# Patient Record
Sex: Female | Born: 1965 | Race: White | Hispanic: No | Marital: Single | Smoking: Former smoker
Health system: Southern US, Community
[De-identification: ages and names within clinical notes are randomized; demographics above are authoritative.]

## PROBLEM LIST (undated history)

## (undated) DIAGNOSIS — T7840XA Allergy, unspecified, initial encounter: Secondary | ICD-10-CM

## (undated) DIAGNOSIS — E7800 Pure hypercholesterolemia, unspecified: Secondary | ICD-10-CM

## (undated) HISTORY — DX: Allergy, unspecified, initial encounter: T78.40XA

## (undated) HISTORY — DX: Pure hypercholesterolemia, unspecified: E78.00

## (undated) HISTORY — PX: ABDOMINAL HYSTERECTOMY: SHX81

---

## 2002-10-20 HISTORY — PX: EYE SURGERY: SHX253

## 2008-08-20 HISTORY — PX: FOOT SURGERY: SHX648

## 2009-06-13 ENCOUNTER — Emergency Department: Payer: Self-pay | Admitting: Emergency Medicine

## 2010-05-08 ENCOUNTER — Ambulatory Visit: Payer: Self-pay | Admitting: Unknown Physician Specialty

## 2010-10-01 ENCOUNTER — Ambulatory Visit: Payer: Self-pay

## 2010-10-10 ENCOUNTER — Ambulatory Visit: Payer: Self-pay

## 2011-09-20 HISTORY — PX: HYSTERECTOMY ABDOMINAL WITH SALPINGECTOMY: SHX6725

## 2014-04-19 HISTORY — PX: BREAST BIOPSY: SHX20

## 2014-04-28 ENCOUNTER — Ambulatory Visit: Payer: Self-pay

## 2017-05-12 ENCOUNTER — Other Ambulatory Visit: Payer: Self-pay | Admitting: Family Medicine

## 2017-05-12 ENCOUNTER — Encounter: Payer: Self-pay | Admitting: Family Medicine

## 2017-05-12 ENCOUNTER — Encounter (INDEPENDENT_AMBULATORY_CARE_PROVIDER_SITE_OTHER): Payer: Self-pay

## 2017-05-12 ENCOUNTER — Ambulatory Visit (INDEPENDENT_AMBULATORY_CARE_PROVIDER_SITE_OTHER): Payer: Managed Care, Other (non HMO) | Admitting: Family Medicine

## 2017-05-12 VITALS — BP 106/70 | HR 71 | Temp 98.1°F | Ht 63.5 in | Wt 155.0 lb

## 2017-05-12 DIAGNOSIS — Z0000 Encounter for general adult medical examination without abnormal findings: Secondary | ICD-10-CM

## 2017-05-12 DIAGNOSIS — E7800 Pure hypercholesterolemia, unspecified: Secondary | ICD-10-CM

## 2017-05-12 DIAGNOSIS — Z131 Encounter for screening for diabetes mellitus: Secondary | ICD-10-CM

## 2017-05-12 DIAGNOSIS — Z13220 Encounter for screening for lipoid disorders: Secondary | ICD-10-CM | POA: Diagnosis not present

## 2017-05-12 DIAGNOSIS — Z1211 Encounter for screening for malignant neoplasm of colon: Secondary | ICD-10-CM

## 2017-05-12 DIAGNOSIS — Z23 Encounter for immunization: Secondary | ICD-10-CM | POA: Diagnosis not present

## 2017-05-12 DIAGNOSIS — E785 Hyperlipidemia, unspecified: Secondary | ICD-10-CM | POA: Insufficient documentation

## 2017-05-12 HISTORY — DX: Pure hypercholesterolemia, unspecified: E78.00

## 2017-05-12 LAB — LIPID PANEL
CHOLESTEROL: 221 mg/dL — AB (ref 0–200)
HDL: 57.7 mg/dL (ref >39.00)
LDL Cholesterol: 135 mg/dL — ABNORMAL HIGH (ref 0–99)
NonHDL: 163.53
TRIGLYCERIDES: 145 mg/dL (ref 0.0–149.0)
Total CHOL/HDL Ratio: 4
VLDL: 29 mg/dL (ref 0.0–40.0)

## 2017-05-12 LAB — BASIC METABOLIC PANEL
BUN: 16 mg/dL (ref 6–23)
CO2: 31 mEq/L (ref 19–32)
Calcium: 10.7 mg/dL — ABNORMAL HIGH (ref 8.4–10.5)
Chloride: 101 mEq/L (ref 96–112)
Creatinine, Ser: 0.83 mg/dL (ref 0.40–1.20)
GFR: 76.91 mL/min (ref >60.00)
Glucose, Bld: 92 mg/dL (ref 70–99)
POTASSIUM: 4 meq/L (ref 3.5–5.1)
Sodium: 140 mEq/L (ref 135–145)

## 2017-05-12 NOTE — Addendum Note (Signed)
Addended by: Pilar Grammes on: 05/12/2017 03:12 PM   Modules accepted: Orders

## 2017-05-12 NOTE — Patient Instructions (Addendum)
Tdap (tetanus and pertussis) today.  Labs today.  Return as needed or in 1 year for next physical.  Health Maintenance, Female Adopting a healthy lifestyle and getting preventive care can go a long way to promote health and wellness. Talk with your health care provider about what schedule of regular examinations is right for you. This is a good chance for you to check in with your provider about disease prevention and staying healthy. In between checkups, there are plenty of things you can do on your own. Experts have done a lot of research about which lifestyle changes and preventive measures are most likely to keep you healthy. Ask your health care provider for more information. Weight and diet Eat a healthy diet  Be sure to include plenty of vegetables, fruits, low-fat dairy products, and lean protein.  Do not eat a lot of foods high in solid fats, added sugars, or salt.  Get regular exercise. This is one of the most important things you can do for your health. ? Most adults should exercise for at least 150 minutes each week. The exercise should increase your heart rate and make you sweat (moderate-intensity exercise). ? Most adults should also do strengthening exercises at least twice a week. This is in addition to the moderate-intensity exercise.  Maintain a healthy weight  Body mass index (BMI) is a measurement that can be used to identify possible weight problems. It estimates body fat based on height and weight. Your health care provider can help determine your BMI and help you achieve or maintain a healthy weight.  For females 51 years of age and older: ? A BMI below 18.5 is considered underweight. ? A BMI of 18.5 to 24.9 is normal. ? A BMI of 25 to 29.9 is considered overweight. ? A BMI of 30 and above is considered obese.  Watch levels of cholesterol and blood lipids  You should start having your blood tested for lipids and cholesterol at 51 years of age, then have this test  every 5 years.  You may need to have your cholesterol levels checked more often if: ? Your lipid or cholesterol levels are high. ? You are older than 51 years of age. ? You are at high risk for heart disease.  Cancer screening Lung Cancer  Lung cancer screening is recommended for adults 51-20 years old who are at high risk for lung cancer because of a history of smoking.  A yearly low-dose CT scan of the lungs is recommended for people who: ? Currently smoke. ? Have quit within the past 15 years. ? Have at least a 30-pack-year history of smoking. A pack year is smoking an average of one pack of cigarettes a day for 1 year.  Yearly screening should continue until it has been 15 years since you quit.  Yearly screening should stop if you develop a health problem that would prevent you from having lung cancer treatment.  Breast Cancer  Practice breast self-awareness. This means understanding how your breasts normally appear and feel.  It also means doing regular breast self-exams. Let your health care provider know about any changes, no matter how small.  If you are in your 51s or 30s, you should have a clinical breast exam (CBE) by a health care provider every 1-3 years as part of a regular health exam.  If you are 16 or older, have a CBE every year. Also consider having a breast X-ray (mammogram) every year.  If you have a family  history of breast cancer, talk to your health care provider about genetic screening.  If you are at high risk for breast cancer, talk to your health care provider about having an MRI and a mammogram every year.  Breast cancer gene (BRCA) assessment is recommended for women who have family members with BRCA-related cancers. BRCA-related cancers include: ? Breast. ? Ovarian. ? Tubal. ? Peritoneal cancers.  Results of the assessment will determine the need for genetic counseling and BRCA1 and BRCA2 testing.  Cervical Cancer Your health care provider  may recommend that you be screened regularly for cancer of the pelvic organs (ovaries, uterus, and vagina). This screening involves a pelvic examination, including checking for microscopic changes to the surface of your cervix (Pap test). You may be encouraged to have this screening done every 3 years, beginning at age 51.  For women ages 65-65, health care providers may recommend pelvic exams and Pap testing every 3 years, or they may recommend the Pap and pelvic exam, combined with testing for human papilloma virus (HPV), every 5 years. Some types of HPV increase your risk of cervical cancer. Testing for HPV may also be done on women of any age with unclear Pap test results.  Other health care providers may not recommend any screening for nonpregnant women who are considered low risk for pelvic cancer and who do not have symptoms. Ask your health care provider if a screening pelvic exam is right for you.  If you have had past treatment for cervical cancer or a condition that could lead to cancer, you need Pap tests and screening for cancer for at least 20 years after your treatment. If Pap tests have been discontinued, your risk factors (such as having a new sexual partner) need to be reassessed to determine if screening should resume. Some women have medical problems that increase the chance of getting cervical cancer. In these cases, your health care provider may recommend more frequent screening and Pap tests.  Colorectal Cancer  This type of cancer can be detected and often prevented.  Routine colorectal cancer screening usually begins at 51 years of age and continues through 51 years of age.  Your health care provider may recommend screening at an earlier age if you have risk factors for colon cancer.  Your health care provider may also recommend using home test kits to check for hidden blood in the stool.  A small camera at the end of a tube can be used to examine your colon directly  (sigmoidoscopy or colonoscopy). This is done to check for the earliest forms of colorectal cancer.  Routine screening usually begins at age 45.  Direct examination of the colon should be repeated every 5-10 years through 51 years of age. However, you may need to be screened more often if early forms of precancerous polyps or small growths are found.  Skin Cancer  Check your skin from head to toe regularly.  Tell your health care provider about any new moles or changes in moles, especially if there is a change in a mole's shape or color.  Also tell your health care provider if you have a mole that is larger than the size of a pencil eraser.  Always use sunscreen. Apply sunscreen liberally and repeatedly throughout the day.  Protect yourself by wearing long sleeves, pants, a wide-brimmed hat, and sunglasses whenever you are outside.  Heart disease, diabetes, and high blood pressure  High blood pressure causes heart disease and increases the risk of stroke.  High blood pressure is more likely to develop in: ? People who have blood pressure in the high end of the normal range (130-139/85-89 mm Hg). ? People who are overweight or obese. ? People who are African American.  If you are 48-73 years of age, have your blood pressure checked every 3-5 years. If you are 45 years of age or older, have your blood pressure checked every year. You should have your blood pressure measured twice-once when you are at a hospital or clinic, and once when you are not at a hospital or clinic. Record the average of the two measurements. To check your blood pressure when you are not at a hospital or clinic, you can use: ? An automated blood pressure machine at a pharmacy. ? A home blood pressure monitor.  If you are between 11 years and 45 years old, ask your health care provider if you should take aspirin to prevent strokes.  Have regular diabetes screenings. This involves taking a blood sample to check your  fasting blood sugar level. ? If you are at a normal weight and have a low risk for diabetes, have this test once every three years after 51 years of age. ? If you are overweight and have a high risk for diabetes, consider being tested at a younger age or more often. Preventing infection Hepatitis B  If you have a higher risk for hepatitis B, you should be screened for this virus. You are considered at high risk for hepatitis B if: ? You were born in a country where hepatitis B is common. Ask your health care provider which countries are considered high risk. ? Your parents were born in a high-risk country, and you have not been immunized against hepatitis B (hepatitis B vaccine). ? You have HIV or AIDS. ? You use needles to inject street drugs. ? You live with someone who has hepatitis B. ? You have had sex with someone who has hepatitis B. ? You get hemodialysis treatment. ? You take certain medicines for conditions, including cancer, organ transplantation, and autoimmune conditions.  Hepatitis C  Blood testing is recommended for: ? Everyone born from [birth year] through 1965. ? Anyone with known risk factors for hepatitis C.  Sexually transmitted infections (STIs)  You should be screened for sexually transmitted infections (STIs) including gonorrhea and chlamydia if: ? You are sexually active and are younger than 51 years of age. ? You are older than 51 years of age and your health care provider tells you that you are at risk for this type of infection. ? Your sexual activity has changed since you were last screened and you are at an increased risk for chlamydia or gonorrhea. Ask your health care provider if you are at risk.  If you do not have HIV, but are at risk, it may be recommended that you take a prescription medicine daily to prevent HIV infection. This is called pre-exposure prophylaxis (PrEP). You are considered at risk if: ? You are sexually active and do not regularly use condoms  or know the HIV status of your partner(s). ? You take drugs by injection. ? You are sexually active with a partner who has HIV.  Talk with your health care provider about whether you are at high risk of being infected with HIV. If you choose to begin PrEP, you should first be tested for HIV. You should then be tested every 3 months for as long as you are taking PrEP. Pregnancy  If you  are premenopausal and you may become pregnant, ask your health care provider about preconception counseling.  If you may become pregnant, take 400 to 800 micrograms (mcg) of folic acid every day.  If you want to prevent pregnancy, talk to your health care provider about birth control (contraception). Osteoporosis and menopause  Osteoporosis is a disease in which the bones lose minerals and strength with aging. This can result in serious bone fractures. Your risk for osteoporosis can be identified using a bone density scan.  If you are 76 years of age or older, or if you are at risk for osteoporosis and fractures, ask your health care provider if you should be screened.  Ask your health care provider whether you should take a calcium or vitamin D supplement to lower your risk for osteoporosis.  Menopause may have certain physical symptoms and risks.  Hormone replacement therapy may reduce some of these symptoms and risks. Talk to your health care provider about whether hormone replacement therapy is right for you. Follow these instructions at home:  Schedule regular health, dental, and eye exams.  Stay current with your immunizations.  Do not use any tobacco products including cigarettes, chewing tobacco, or electronic cigarettes.  If you are pregnant, do not drink alcohol.  If you are breastfeeding, limit how much and how often you drink alcohol.  Limit alcohol intake to no more than 1 drink per day for nonpregnant women. One drink equals 12 ounces of beer, 5 ounces of wine, or 1 ounces of hard  liquor.  Do not use street drugs.  Do not share needles.  Ask your health care provider for help if you need support or information about quitting drugs.  Tell your health care provider if you often feel depressed.  Tell your health care provider if you have ever been abused or do not feel safe at home. This information is not intended to replace advice given to you by your health care provider. Make sure you discuss any questions you have with your health care provider. Document Released: 04/21/2011 Document Revised: 03/13/2016 Document Reviewed: 07/10/2015 Elsevier Interactive Patient Education  [employer].

## 2017-05-12 NOTE — Progress Notes (Signed)
BP 106/70 (BP Location: Left Arm, Patient Position: Sitting, Cuff Size: Normal)   Pulse 71   Temp 98.1 F (36.7 C) (Oral)   Ht 5' 3.5" (1.613 m)   Wt 155 lb (70.3 kg)   SpO2 93%   BMI 27.03 kg/m    CC: new pt would like CPE Subjective:    Patient ID: Anne Moreno, female    DOB: September 03, 1966, 51 y.o.   MRN: [id number]  HPI: Anne Moreno is a 51 y.o. female presenting on 05/12/2017 for Establish Care (Would like to do a CPE today if possible. Sees a GYN. Last Pap 04/2016.)   New pt to establish care today.  Prior saw Dr Jeananne Rama in [place] last month so she's on 2 antihistamines during recovery.   Preventative: Well woman - OBGYN ([facility]), last pelvic exam 04/2016. S/p hysterectomy 2012 for heavy bleeding, ovaries remain. Some menopausal symptoms.  Colon cancer screening - discussed, would like screening colonoscopy.  Flu shot doesn't receive Tetanus last 1996, Tdap 2018 Seat belt use discussed Sunscreen use discussed. No changing moles on skin. Some day smoker - seldom. Quit full time smoking age 60yo. 7.5 PY hx.  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 Interim medical history since our last visit reviewed. Allergies and medications reviewed and updated. No outpatient prescriptions prior to visit.   No facility-administered medications prior to visit.      Per HPI unless specifically indicated in ROS section below Review of Systems  Constitutional: Negative for activity change, appetite change, chills, fatigue, fever and unexpected weight change.  HENT: Negative for hearing loss.   Eyes: Negative for visual disturbance.    Respiratory: Negative for cough, chest tightness, shortness of breath and wheezing.   Cardiovascular: Negative for chest pain, palpitations and leg swelling.  Gastrointestinal: Negative for abdominal distention, abdominal pain, blood in stool, constipation, diarrhea, nausea and vomiting.  Genitourinary: Negative for difficulty urinating and hematuria.  Musculoskeletal: Negative for arthralgias, myalgias and neck pain.  Skin: Negative for rash.  Neurological: Negative for dizziness, seizures, syncope and headaches.  Hematological: Negative for adenopathy. Does not bruise/bleed easily.  Psychiatric/Behavioral: Negative for dysphoric mood. The patient is not nervous/anxious.        Objective:    BP 106/70 (BP Location: Left Arm, Patient Position: Sitting, Cuff Size: Normal)   Pulse 71   Temp 98.1 F (36.7 C) (Oral)   Ht 5' 3.5" (1.613 m)   Wt 155 lb (70.3 kg)   SpO2 93%   BMI 27.03 kg/m   Wt Readings from Last 3 Encounters:  05/12/17 155 lb (70.3 kg)    Physical Exam  Constitutional: She is oriented to person, place, and time. She appears well-developed and well-nourished. No distress.  HENT:  Head: Normocephalic and atraumatic.  Right Ear: Hearing, tympanic membrane, external ear and ear canal normal.  Left Ear: Hearing, tympanic membrane, external ear and ear canal normal.  Nose: Nose normal.  Mouth/Throat: Uvula is midline, oropharynx is clear and moist and mucous membranes are normal. No oropharyngeal exudate, posterior oropharyngeal edema or posterior oropharyngeal erythema.  Eyes: Pupils are equal, round, and reactive to light. Conjunctivae and EOM are normal. No scleral icterus.  Neck: Normal  range of motion. Neck supple. No thyromegaly present.  Cardiovascular: Normal rate, regular rhythm, normal heart sounds and intact distal pulses.   No murmur heard. Pulses:      Radial pulses are 2+ on the right side, and 2+ on the left side.  Pulmonary/Chest: Effort normal and  breath sounds normal. No respiratory distress. She has no wheezes. She has no rales.  Abdominal: Soft. Bowel sounds are normal. She exhibits no distension and no mass. There is no tenderness. There is no rebound and no guarding.  Musculoskeletal: Normal range of motion. She exhibits no edema.  Lymphadenopathy:    She has no cervical adenopathy.  Neurological: She is alert and oriented to person, place, and time.  CN grossly intact, station and gait intact  Skin: Skin is warm and dry. No rash noted.  Psychiatric: She has a normal mood and affect. Her behavior is normal. Judgment and thought content normal.  Nursing note and vitals reviewed.  No results found for this or any previous visit.    Assessment & Plan:   Problem List Items Addressed This Visit    Health maintenance examination - Primary    Preventative protocols reviewed and updated unless pt declined. Discussed healthy diet and lifestyle.        Other Visit Diagnoses    Special screening for malignant neoplasms, colon       Relevant Orders   Ambulatory referral to Gastroenterology   Lipid screening       Relevant Orders   Lipid panel   Diabetes mellitus screening       Relevant Orders   Basic metabolic panel       Follow up plan: Return in about 1 year (around 05/12/2018) for annual exam, prior fasting for blood work.  Ria Bush, MD

## 2017-05-12 NOTE — Assessment & Plan Note (Signed)
Preventative protocols reviewed and updated unless pt declined. Discussed healthy diet and lifestyle.  

## 2017-05-13 ENCOUNTER — Encounter: Payer: Self-pay | Admitting: *Deleted

## 2017-05-18 ENCOUNTER — Telehealth: Payer: Self-pay | Admitting: Family Medicine

## 2017-05-18 NOTE — Telephone Encounter (Signed)
Pt called with questions concerning recent labs. She is requesting a cb to discuss.

## 2017-05-18 NOTE — Telephone Encounter (Signed)
Spoke to pt and reviewed labs

## 2017-06-12 ENCOUNTER — Encounter: Payer: Self-pay | Admitting: Family Medicine

## 2017-06-15 ENCOUNTER — Other Ambulatory Visit (INDEPENDENT_AMBULATORY_CARE_PROVIDER_SITE_OTHER): Payer: Managed Care, Other (non HMO)

## 2017-06-15 DIAGNOSIS — E7800 Pure hypercholesterolemia, unspecified: Secondary | ICD-10-CM

## 2017-06-15 LAB — LIPID PANEL
Cholesterol: 204 mg/dL — ABNORMAL HIGH (ref 0–200)
HDL: 51.2 mg/dL (ref >39.00)
LDL Cholesterol: 135 mg/dL — ABNORMAL HIGH (ref 0–99)
NONHDL: 153.14
TRIGLYCERIDES: 90 mg/dL (ref 0.0–149.0)
Total CHOL/HDL Ratio: 4
VLDL: 18 mg/dL (ref 0.0–40.0)

## 2017-06-15 LAB — CALCIUM: Calcium: 10 mg/dL (ref 8.4–10.5)

## 2017-12-30 ENCOUNTER — Telehealth: Payer: Managed Care, Other (non HMO) | Admitting: Nurse Practitioner

## 2017-12-30 DIAGNOSIS — J309 Allergic rhinitis, unspecified: Secondary | ICD-10-CM

## 2017-12-30 MED ORDER — FEXOFENADINE HCL 180 MG PO TABS: 180.0000 mg | ORAL_TABLET | Freq: Every day | ORAL | 5 refills | Status: DC

## 2017-12-30 NOTE — Addendum Note (Signed)
Addended by: Chevis Pretty on: 12/30/2017 01:11 PM   Modules accepted: Orders

## 2017-12-30 NOTE — Progress Notes (Signed)
E visit for Allergic Rhinitis We are sorry that you are not feeling well.  Her is how we plan to help!  Based on what you have shared with me it looks like you have Allergic Rhinitis.  Rhinitis is when a reaction occurs that causes nasal congestion, runny nose, sneezing, and itching.  Most types of rhinitis are caused by an inflammation and are associated with symptoms in the eyes ears or throat. There are several types of rhinitis.  The most common are acute rhinitis, which is usually caused by a viral illness, allergic or seasonal rhinitis, and nonallergic or year-round rhinitis.  Nasal allergies occur certain times of the year.  Allergic rhinitis is caused when allergens in the air trigger the release of histamine in the body.  Histamine causes itching, swelling, and fluid to build up in the fragile linings of the nasal passages, sinuses and eyelids.  An itchy nose and clear discharge are common.  I recommend the following over the counter treatments: Clarinex 5 mg take 1 tablet daily 9Cannot take if pregnant or breastfeeding)  I also would recommend a nasal spray: Flonase 2 sprays into each nostril once daily  You may also benefit from eye drops such as: Visine 1-2 drops each eye twice daily as needed  HOME CARE:   You can use an over-the-counter saline nasal spray as needed  Avoid areas where there is heavy dust, mites, or molds  Stay indoors on windy days during the pollen season  Keep windows closed in home, at least in bedroom; use air conditioner.  Use high-efficiency house air filter  Keep windows closed in car, turn AC on re-circulate  Avoid playing out with dog during pollen season  GET HELP RIGHT AWAY IF:   If your symptoms do not improve within 10 days  You become short of breath  You develop yellow or green discharge from your nose for over 3 days  You have coughing fits  MAKE SURE YOU:   Understand these instructions  Will watch your condition  Will  get help right away if you are not doing well or get worse  Thank you for choosing an e-visit. Your e-visit answers were reviewed by a board certified advanced clinical practitioner to complete your personal care plan. Depending upon the condition, your plan could have included both over the counter or prescription medications. Please review your pharmacy choice. Be sure that the pharmacy you have chosen is open so that you can pick up your prescription now.  If there is a problem you may message your provider in [place] to have the prescription routed to another pharmacy. Your safety is important to [place]. If you have drug allergies check your prescription carefully.  For the next 24 hours, you can use MyChart to ask questions about today's visit, request a non-urgent call back, or ask for a work or school excuse from your e-visit provider. You will get an email in the next two days asking about your experience. I hope that your e-visit has been valuable and will speed your recovery.

## 2018-04-19 ENCOUNTER — Ambulatory Visit (INDEPENDENT_AMBULATORY_CARE_PROVIDER_SITE_OTHER): Payer: Managed Care, Other (non HMO) | Admitting: Family Medicine

## 2018-04-19 ENCOUNTER — Encounter: Payer: Self-pay | Admitting: Family Medicine

## 2018-04-19 VITALS — BP 118/80 | HR 71 | Temp 98.3°F | Ht 64.0 in | Wt 151.8 lb

## 2018-04-19 DIAGNOSIS — G4761 Periodic limb movement disorder: Secondary | ICD-10-CM

## 2018-04-19 DIAGNOSIS — G2581 Restless legs syndrome: Secondary | ICD-10-CM | POA: Insufficient documentation

## 2018-04-19 DIAGNOSIS — B9789 Other viral agents as the cause of diseases classified elsewhere: Secondary | ICD-10-CM | POA: Diagnosis not present

## 2018-04-19 DIAGNOSIS — R232 Flushing: Secondary | ICD-10-CM

## 2018-04-19 DIAGNOSIS — J069 Acute upper respiratory infection, unspecified: Secondary | ICD-10-CM

## 2018-04-19 NOTE — Progress Notes (Signed)
BP 118/80 (BP Location: Left Arm, Patient Position: Sitting, Cuff Size: Normal)   Pulse 71   Temp 98.3 F (36.8 C) (Oral)   Ht 5\' 4"  (1.626 m)   Wt 151 lb 12 oz (68.8 kg)   SpO2 98%   BMI 26.05 kg/m    CC: discuss sleep Subjective:    Patient ID: Anne Moreno, female    DOB: 1965/12/21, 52 y.o.   MRN: [id number]  HPI: Anne Moreno is a 52 y.o. female presenting on 04/19/2018 for Sleep issues (Denies any trouble falling asleep or staying asleep. Says according to her FitBit, pt is restless during the night. Unless she has a hotflash, then she wakes up.)   Easily falls asleep. She does snore - related to deviated septum. Also using night guard - for snoring - which has helped. She started using Fitbit 2 yrs ago - it tells her she is restless at night. This does not wake her up. Hot flashes can wake her, not limb movements.  Doesn't feel rested when awakening. Minimal daytime sleepiness. No PNDyspnea.   Sleeps alone.  Legs occasionally cramp, but not frequent.   LMP - 2011 s/p partial hysterectomy (ovaries remain).   Some sore throat over the last week. No fevers. Feels well during the day. Some head congestion, cough.   Relevant past medical, surgical, family and social history reviewed and updated as indicated. Interim medical history since our last visit reviewed. Allergies and medications reviewed and updated. Outpatient Medications Prior to Visit  Medication Sig Dispense Refill  . fexofenadine (ALLEGRA) 180 MG tablet Take 1 tablet (180 mg total) by mouth daily. 30 tablet 5  . OVER THE COUNTER MEDICATION Amberen for Menopausal Symptoms     No facility-administered medications prior to visit.      Per HPI unless specifically indicated in ROS section below Review of Systems     Objective:    BP 118/80 (BP Location: Left Arm, Patient Position: Sitting, Cuff Size: Normal)   Pulse 71   Temp 98.3 F (36.8 C) (Oral)   Ht 5\' 4"  (1.626 m)   Wt 151 lb 12  oz (68.8 kg)   SpO2 98%   BMI 26.05 kg/m   Wt Readings from Last 3 Encounters:  04/19/18 151 lb 12 oz (68.8 kg)  05/12/17 155 lb (70.3 kg)    Physical Exam  Constitutional: She appears well-developed and well-nourished. No distress.  HENT:  Head: Normocephalic and atraumatic.  Right Ear: Hearing, tympanic membrane, external ear and ear canal normal.  Left Ear: Hearing, tympanic membrane, external ear and ear canal normal.  Nose: No mucosal edema or rhinorrhea. Right sinus exhibits no maxillary sinus tenderness and no frontal sinus tenderness. Left sinus exhibits no maxillary sinus tenderness and no frontal sinus tenderness.  Mouth/Throat: Uvula is midline, oropharynx is clear and moist and mucous membranes are normal. No oropharyngeal exudate, posterior oropharyngeal edema, posterior oropharyngeal erythema or tonsillar abscesses.  Eyes: Pupils are equal, round, and reactive to light. Conjunctivae and EOM are normal. No scleral icterus.  Neck: Normal range of motion. Neck supple.  Cardiovascular: Normal rate, regular rhythm, normal heart sounds and intact distal pulses.  No murmur heard. Pulmonary/Chest: Effort normal and breath sounds normal. No respiratory distress. She has no wheezes. She has no rales.  Lymphadenopathy:    She has no cervical adenopathy.  Skin: Skin is warm and dry. No rash noted.  Nursing note and vitals reviewed.  Results for orders placed or performed in  visit on 06/15/17  Lipid panel  Result Value Ref Range   Cholesterol 204 (H) 0 - 200 mg/dL   Triglycerides 90.0 0.0 - 149.0 mg/dL   HDL 51.20 >39.00 mg/dL   VLDL 18.0 0.0 - 40.0 mg/dL   LDL Cholesterol 135 (H) 0 - 99 mg/dL   Total CHOL/HDL Ratio 4    NonHDL 153.14   Calcium  Result Value Ref Range   Calcium 10.0 8.4 - 10.5 mg/dL      Assessment & Plan:   Problem List Items Addressed This Visit    Viral URI with cough    Anticipate viral given short duration.  Supportive care reviewed. Update if fever  >101, or worsening symptoms or persistent past 10 days. Update if not improving with treatment.       Periodic limb movements of sleep - Primary    Describes periodic leg movements of sleep - predominantly noted due to fit bit tracker - but these do not awake her. Advised no need to treat this. Does not have disorder. Will monitor.      Hot flashes    Encouraged she f/u with GYN to discuss treatment options.          No orders of the defined types were placed in this encounter.  No orders of the defined types were placed in this encounter.   Follow up plan: No follow-ups on file.  Ria Bush, MD

## 2018-04-19 NOTE — Assessment & Plan Note (Signed)
Encouraged she f/u with GYN to discuss treatment options.

## 2018-04-19 NOTE — Assessment & Plan Note (Signed)
Anticipate viral given short duration.  Supportive care reviewed. Update if fever >101, or worsening symptoms or persistent past 10 days. Update if not improving with treatment.

## 2018-04-19 NOTE — Patient Instructions (Signed)
You are describing periodic leg movements of sleep - if not awaking with these, ok to watch and no need to treat.  I do want you to touch base with GYN about hot flashes and possible treatments.

## 2018-04-19 NOTE — Assessment & Plan Note (Signed)
Describes periodic leg movements of sleep - predominantly noted due to fit bit tracker - but these do not awake her. Advised no need to treat this. Does not have disorder. Will monitor.

## 2018-05-13 ENCOUNTER — Other Ambulatory Visit: Payer: Self-pay | Admitting: Family Medicine

## 2018-05-13 DIAGNOSIS — E7800 Pure hypercholesterolemia, unspecified: Secondary | ICD-10-CM

## 2018-05-14 ENCOUNTER — Other Ambulatory Visit (INDEPENDENT_AMBULATORY_CARE_PROVIDER_SITE_OTHER): Payer: Managed Care, Other (non HMO)

## 2018-05-14 ENCOUNTER — Encounter: Payer: Self-pay | Admitting: Family Medicine

## 2018-05-14 DIAGNOSIS — E7800 Pure hypercholesterolemia, unspecified: Secondary | ICD-10-CM | POA: Diagnosis not present

## 2018-05-14 LAB — COMPREHENSIVE METABOLIC PANEL
ALBUMIN: 4.5 g/dL (ref 3.5–5.2)
ALK PHOS: 56 U/L (ref 39–117)
ALT: 8 U/L (ref 0–35)
AST: 16 U/L (ref 0–37)
BUN: 13 mg/dL (ref 6–23)
CALCIUM: 9.9 mg/dL (ref 8.4–10.5)
CO2: 29 mEq/L (ref 19–32)
CREATININE: 1.02 mg/dL (ref 0.40–1.20)
Chloride: 103 mEq/L (ref 96–112)
GFR: 60.39 mL/min (ref >60.00)
Glucose, Bld: 96 mg/dL (ref 70–99)
Potassium: 4.5 mEq/L (ref 3.5–5.1)
Sodium: 140 mEq/L (ref 135–145)
TOTAL PROTEIN: 7.3 g/dL (ref 6.0–8.3)
Total Bilirubin: 0.9 mg/dL (ref 0.2–1.2)

## 2018-05-14 LAB — LIPID PANEL
CHOLESTEROL: 231 mg/dL — AB (ref 0–200)
HDL: 62.2 mg/dL (ref >39.00)
LDL Cholesterol: 139 mg/dL — ABNORMAL HIGH (ref 0–99)
NonHDL: 169.25
TRIGLYCERIDES: 151 mg/dL — AB (ref 0.0–149.0)
Total CHOL/HDL Ratio: 4
VLDL: 30.2 mg/dL (ref 0.0–40.0)

## 2018-05-19 ENCOUNTER — Encounter: Payer: Managed Care, Other (non HMO) | Admitting: Family Medicine

## 2018-05-26 ENCOUNTER — Encounter: Payer: Self-pay | Admitting: Family Medicine

## 2018-05-26 ENCOUNTER — Ambulatory Visit (INDEPENDENT_AMBULATORY_CARE_PROVIDER_SITE_OTHER): Payer: Managed Care, Other (non HMO) | Admitting: Family Medicine

## 2018-05-26 VITALS — BP 122/68 | HR 66 | Temp 98.0°F | Ht 63.0 in | Wt 149.8 lb

## 2018-05-26 DIAGNOSIS — Z1211 Encounter for screening for malignant neoplasm of colon: Secondary | ICD-10-CM

## 2018-05-26 DIAGNOSIS — E7800 Pure hypercholesterolemia, unspecified: Secondary | ICD-10-CM | POA: Diagnosis not present

## 2018-05-26 DIAGNOSIS — Z0000 Encounter for general adult medical examination without abnormal findings: Secondary | ICD-10-CM | POA: Diagnosis not present

## 2018-05-26 MED ORDER — FEXOFENADINE HCL 180 MG PO TABS: 180.0000 mg | ORAL_TABLET | Freq: Every day | ORAL | 11 refills | Status: DC

## 2018-05-26 NOTE — Patient Instructions (Addendum)
Good to see you today. Work on low cholesterol diet - fruits and vegetables, more fish, less red meat and dairy products. More soy, nuts, beans, barley, lentils, oats and plant sterol ester enriched margarine instead of butter.  Return as needed or in 1 year for next physical.  Health Maintenance, Female Adopting a healthy lifestyle and getting preventive care can go a long way to promote health and wellness. Talk with your health care provider about what schedule of regular examinations is right for you. This is a good chance for you to check in with your provider about disease prevention and staying healthy. In between checkups, there are plenty of things you can do on your own. Experts have done a lot of research about which lifestyle changes and preventive measures are most likely to keep you healthy. Ask your health care provider for more information. Weight and diet Eat a healthy diet  Be sure to include plenty of vegetables, fruits, low-fat dairy products, and lean protein.  Do not eat a lot of foods high in solid fats, added sugars, or salt.  Get regular exercise. This is one of the most important things you can do for your health. ? Most adults should exercise for at least 150 minutes each week. The exercise should increase your heart rate and make you sweat (moderate-intensity exercise). ? Most adults should also do strengthening exercises at least twice a week. This is in addition to the moderate-intensity exercise.  Maintain a healthy weight  Body mass index (BMI) is a measurement that can be used to identify possible weight problems. It estimates body fat based on height and weight. Your health care provider can help determine your BMI and help you achieve or maintain a healthy weight.  For females 86 years of age and older: ? A BMI below 18.5 is considered underweight. ? A BMI of 18.5 to 24.9 is normal. ? A BMI of 25 to 29.9 is considered overweight. ? A BMI of 30 and above is  considered obese.  Watch levels of cholesterol and blood lipids  You should start having your blood tested for lipids and cholesterol at 52 years of age, then have this test every 5 years.  You may need to have your cholesterol levels checked more often if: ? Your lipid or cholesterol levels are high. ? You are older than 52 years of age. ? You are at high risk for heart disease.  Cancer screening Lung Cancer  Lung cancer screening is recommended for adults 23-34 years old who are at high risk for lung cancer because of a history of smoking.  A yearly low-dose CT scan of the lungs is recommended for people who: ? Currently smoke. ? Have quit within the past 15 years. ? Have at least a 30-pack-year history of smoking. A pack year is smoking an average of one pack of cigarettes a day for 1 year.  Yearly screening should continue until it has been 15 years since you quit.  Yearly screening should stop if you develop a health problem that would prevent you from having lung cancer treatment.  Breast Cancer  Practice breast self-awareness. This means understanding how your breasts normally appear and feel.  It also means doing regular breast self-exams. Let your health care provider know about any changes, no matter how small.  If you are in your 20s or 30s, you should have a clinical breast exam (CBE) by a health care provider every 1-3 years as part of a  regular health exam.  If you are 40 or older, have a CBE every year. Also consider having a breast X-ray (mammogram) every year.  If you have a family history of breast cancer, talk to your health care provider about genetic screening.  If you are at high risk for breast cancer, talk to your health care provider about having an MRI and a mammogram every year.  Breast cancer gene (BRCA) assessment is recommended for women who have family members with BRCA-related cancers. BRCA-related cancers  include: ? Breast. ? Ovarian. ? Tubal. ? Peritoneal cancers.  Results of the assessment will determine the need for genetic counseling and BRCA1 and BRCA2 testing.  Cervical Cancer Your health care provider may recommend that you be screened regularly for cancer of the pelvic organs (ovaries, uterus, and vagina). This screening involves a pelvic examination, including checking for microscopic changes to the surface of your cervix (Pap test). You may be encouraged to have this screening done every 3 years, beginning at age 74.  For women ages 34-65, health care providers may recommend pelvic exams and Pap testing every 3 years, or they may recommend the Pap and pelvic exam, combined with testing for human papilloma virus (HPV), every 5 years. Some types of HPV increase your risk of cervical cancer. Testing for HPV may also be done on women of any age with unclear Pap test results.  Other health care providers may not recommend any screening for nonpregnant women who are considered low risk for pelvic cancer and who do not have symptoms. Ask your health care provider if a screening pelvic exam is right for you.  If you have had past treatment for cervical cancer or a condition that could lead to cancer, you need Pap tests and screening for cancer for at least 20 years after your treatment. If Pap tests have been discontinued, your risk factors (such as having a new sexual partner) need to be reassessed to determine if screening should resume. Some women have medical problems that increase the chance of getting cervical cancer. In these cases, your health care provider may recommend more frequent screening and Pap tests.  Colorectal Cancer  This type of cancer can be detected and often prevented.  Routine colorectal cancer screening usually begins at 52 years of age and continues through 52 years of age.  Your health care provider may recommend screening at an earlier age if you have risk factors  for colon cancer.  Your health care provider may also recommend using home test kits to check for hidden blood in the stool.  A small camera at the end of a tube can be used to examine your colon directly (sigmoidoscopy or colonoscopy). This is done to check for the earliest forms of colorectal cancer.  Routine screening usually begins at age 70.  Direct examination of the colon should be repeated every 5-10 years through 52 years of age. However, you may need to be screened more often if early forms of precancerous polyps or small growths are found.  Skin Cancer  Check your skin from head to toe regularly.  Tell your health care provider about any new moles or changes in moles, especially if there is a change in a mole's shape or color.  Also tell your health care provider if you have a mole that is larger than the size of a pencil eraser.  Always use sunscreen. Apply sunscreen liberally and repeatedly throughout the day.  Protect yourself by wearing long sleeves, pants,  a wide-brimmed hat, and sunglasses whenever you are outside.  Heart disease, diabetes, and high blood pressure  High blood pressure causes heart disease and increases the risk of stroke. High blood pressure is more likely to develop in: ? People who have blood pressure in the high end of the normal range (130-139/85-89 mm Hg). ? People who are overweight or obese. ? People who are African American.  If you are 82-70 years of age, have your blood pressure checked every 3-5 years. If you are 56 years of age or older, have your blood pressure checked every year. You should have your blood pressure measured twice-once when you are at a hospital or clinic, and once when you are not at a hospital or clinic. Record the average of the two measurements. To check your blood pressure when you are not at a hospital or clinic, you can use: ? An automated blood pressure machine at a pharmacy. ? A home blood pressure monitor.  If  you are between 62 years and 4 years old, ask your health care provider if you should take aspirin to prevent strokes.  Have regular diabetes screenings. This involves taking a blood sample to check your fasting blood sugar level. ? If you are at a normal weight and have a low risk for diabetes, have this test once every three years after 52 years of age. ? If you are overweight and have a high risk for diabetes, consider being tested at a younger age or more often. Preventing infection Hepatitis B  If you have a higher risk for hepatitis B, you should be screened for this virus. You are considered at high risk for hepatitis B if: ? You were born in a country where hepatitis B is common. Ask your health care provider which countries are considered high risk. ? Your parents were born in a high-risk country, and you have not been immunized against hepatitis B (hepatitis B vaccine). ? You have HIV or AIDS. ? You use needles to inject street drugs. ? You live with someone who has hepatitis B. ? You have had sex with someone who has hepatitis B. ? You get hemodialysis treatment. ? You take certain medicines for conditions, including cancer, organ transplantation, and autoimmune conditions.  Hepatitis C  Blood testing is recommended for: ? Everyone born from [birth year] through 1965. ? Anyone with known risk factors for hepatitis C.  Sexually transmitted infections (STIs)  You should be screened for sexually transmitted infections (STIs) including gonorrhea and chlamydia if: ? You are sexually active and are younger than 52 years of age. ? You are older than 52 years of age and your health care provider tells you that you are at risk for this type of infection. ? Your sexual activity has changed since you were last screened and you are at an increased risk for chlamydia or gonorrhea. Ask your health care provider if you are at risk.  If you do not have HIV, but are at risk, it may be recommended  that you take a prescription medicine daily to prevent HIV infection. This is called pre-exposure prophylaxis (PrEP). You are considered at risk if: ? You are sexually active and do not regularly use condoms or know the HIV status of your partner(s). ? You take drugs by injection. ? You are sexually active with a partner who has HIV.  Talk with your health care provider about whether you are at high risk of being infected with HIV. If you choose  to begin PrEP, you should first be tested for HIV. You should then be tested every 3 months for as long as you are taking PrEP. Pregnancy  If you are premenopausal and you may become pregnant, ask your health care provider about preconception counseling.  If you may become pregnant, take 400 to 800 micrograms (mcg) of folic acid every day.  If you want to prevent pregnancy, talk to your health care provider about birth control (contraception). Osteoporosis and menopause  Osteoporosis is a disease in which the bones lose minerals and strength with aging. This can result in serious bone fractures. Your risk for osteoporosis can be identified using a bone density scan.  If you are 26 years of age or older, or if you are at risk for osteoporosis and fractures, ask your health care provider if you should be screened.  Ask your health care provider whether you should take a calcium or vitamin D supplement to lower your risk for osteoporosis.  Menopause may have certain physical symptoms and risks.  Hormone replacement therapy may reduce some of these symptoms and risks. Talk to your health care provider about whether hormone replacement therapy is right for you. Follow these instructions at home:  Schedule regular health, dental, and eye exams.  Stay current with your immunizations.  Do not use any tobacco products including cigarettes, chewing tobacco, or electronic cigarettes.  If you are pregnant, do not drink alcohol.  If you are  breastfeeding, limit how much and how often you drink alcohol.  Limit alcohol intake to no more than 1 drink per day for nonpregnant women. One drink equals 12 ounces of beer, 5 ounces of wine, or 1 ounces of hard liquor.  Do not use street drugs.  Do not share needles.  Ask your health care provider for help if you need support or information about quitting drugs.  Tell your health care provider if you often feel depressed.  Tell your health care provider if you have ever been abused or do not feel safe at home. This information is not intended to replace advice given to you by your health care provider. Make sure you discuss any questions you have with your health care provider. Document Released: 04/21/2011 Document Revised: 03/13/2016 Document Reviewed: 07/10/2015 Elsevier Interactive Patient Education  [employer].

## 2018-05-26 NOTE — Assessment & Plan Note (Signed)
Chronic, mild off meds. The 10-year ASCVD risk score Mikey Bussing DC Brooke Bonito., et al., 2013) is: 3.8%   Values used to calculate the score:     Age: 52 years     Sex: Female     Is Non-Hispanic African American: No     Diabetic: No     Tobacco smoker: Yes     Systolic Blood Pressure: 606 mmHg     Is BP treated: No     HDL Cholesterol: 62.2 mg/dL     Total Cholesterol: 231 mg/dL

## 2018-05-26 NOTE — Assessment & Plan Note (Signed)
Preventative protocols reviewed and updated unless pt declined. Discussed healthy diet and lifestyle.  

## 2018-05-26 NOTE — Progress Notes (Signed)
BP 122/68 (BP Location: Left Arm, Patient Position: Sitting, Cuff Size: Normal)   Pulse 66   Temp 98 F (36.7 C) (Oral)   Ht 5\' 3"  (1.6 m)   Wt 149 lb 12 oz (67.9 kg)   SpO2 98%   BMI 26.53 kg/m    CC: CPE Subjective:    Patient ID: Anne Moreno, female    DOB: Feb 10, 1966, 52 y.o.   MRN: [id number]  HPI: Rodina Pinales is a 52 y.o. female presenting on 05/26/2018 for Annual Exam   Preventative: Well woman - OBGYN ([facility]), last pelvic exam 04/2016. S/p hysterectomy 2012 for heavy bleeding, ovaries remain. Menopausal symptoms - hot flashes.  Colon cancer screening - discussed, would like screening colonoscopy. Last year she had trouble scheduling transportation but would like new referral this year.  Flu shot doesn't receive Tetanus last 1996, Tdap 2018 Seat belt use discussed Sunscreen use discussed. No changing moles on skin. Some day smoker - seldom. Quit full time smoking age 55yo. 7.5 PY hx.  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 Interim medical history since our last visit reviewed. Allergies and medications reviewed and updated. Outpatient Medications Prior to Visit  Medication Sig Dispense Refill  . fexofenadine (ALLEGRA) 180 MG tablet Take 1 tablet (180 mg total) by mouth daily. 30 tablet 5   No facility-administered medications prior to visit.      Per HPI unless specifically indicated in ROS section below Review of Systems  Constitutional: Negative for activity change, appetite change, chills, fatigue, fever and unexpected weight change.  HENT: Negative for hearing loss.   Eyes:  Negative for visual disturbance.  Respiratory: Negative for cough, chest tightness, shortness of breath and wheezing.   Cardiovascular: Negative for chest pain, palpitations and leg swelling.  Gastrointestinal: Negative for abdominal distention, abdominal pain, blood in stool, constipation, diarrhea, nausea and vomiting.  Genitourinary: Negative for difficulty urinating and hematuria.  Musculoskeletal: Negative for arthralgias, myalgias and neck pain.  Skin: Negative for rash.  Neurological: Negative for dizziness, seizures, syncope and headaches.  Hematological: Negative for adenopathy. Does not bruise/bleed easily.  Psychiatric/Behavioral: Negative for dysphoric mood. The patient is not nervous/anxious.        Objective:    BP 122/68 (BP Location: Left Arm, Patient Position: Sitting, Cuff Size: Normal)   Pulse 66   Temp 98 F (36.7 C) (Oral)   Ht 5\' 3"  (1.6 m)   Wt 149 lb 12 oz (67.9 kg)   SpO2 98%   BMI 26.53 kg/m   Wt Readings from Last 3 Encounters:  05/26/18 149 lb 12 oz (67.9 kg)  04/19/18 151 lb 12 oz (68.8 kg)  05/12/17 155 lb (70.3 kg)    Physical Exam  Constitutional: She is oriented to person, place, and time. She appears well-developed and well-nourished. No distress.  HENT:  Head: Normocephalic and atraumatic.  Right Ear: Hearing, tympanic membrane, external ear and ear canal normal.  Left Ear: Hearing, tympanic membrane, external ear and ear canal normal.  Nose: Nose normal.  Mouth/Throat: Uvula is midline, oropharynx is clear and moist and mucous membranes are normal. No oropharyngeal exudate, posterior oropharyngeal edema or posterior oropharyngeal  erythema.  Eyes: Pupils are equal, round, and reactive to light. Conjunctivae and EOM are normal. No scleral icterus.  Neck: Normal range of motion. Neck supple. No thyromegaly present.  Cardiovascular: Normal rate, regular rhythm, normal heart sounds and intact distal pulses.  No murmur heard. Pulses:      Radial  pulses are 2+ on the right side, and 2+ on the left side.  Pulmonary/Chest: Effort normal and breath sounds normal. No respiratory distress. She has no wheezes. She has no rales.  Abdominal: Soft. Bowel sounds are normal. She exhibits no distension and no mass. There is no tenderness. There is no rebound and no guarding.  Musculoskeletal: Normal range of motion. She exhibits no edema.  Lymphadenopathy:    She has no cervical adenopathy.  Neurological: She is alert and oriented to person, place, and time.  CN grossly intact, station and gait intact  Skin: Skin is warm and dry. No rash noted.  Psychiatric: She has a normal mood and affect. Her behavior is normal. Judgment and thought content normal.  Nursing note and vitals reviewed.  Results for orders placed or performed in visit on 05/14/18  Lipid panel  Result Value Ref Range   Cholesterol 231 (H) 0 - 200 mg/dL   Triglycerides 151.0 (H) 0.0 - 149.0 mg/dL   HDL 62.20 >39.00 mg/dL   VLDL 30.2 0.0 - 40.0 mg/dL   LDL Cholesterol 139 (H) 0 - 99 mg/dL   Total CHOL/HDL Ratio 4    NonHDL 169.25   Comprehensive metabolic panel  Result Value Ref Range   Sodium 140 135 - 145 mEq/L   Potassium 4.5 3.5 - 5.1 mEq/L   Chloride 103 96 - 112 mEq/L   CO2 29 19 - 32 mEq/L   Glucose, Bld 96 70 - 99 mg/dL   BUN 13 6 - 23 mg/dL   Creatinine, Ser 1.02 0.40 - 1.20 mg/dL   Total Bilirubin 0.9 0.2 - 1.2 mg/dL   Alkaline Phosphatase 56 39 - 117 U/L   AST 16 0 - 37 U/L   ALT 8 0 - 35 U/L   Total Protein 7.3 6.0 - 8.3 g/dL   Albumin 4.5 3.5 - 5.2 g/dL   Calcium 9.9 8.4 - 10.5 mg/dL   GFR 60.39 >60.00 mL/min      Assessment & Plan:   Problem List Items Addressed This Visit    Pure hypercholesterolemia    Chronic, mild off meds. The 10-year ASCVD risk score Mikey Bussing DC Brooke Bonito., et al., 2013) is: 3.8%   Values used to calculate the score:     Age: 63 years     Sex: Female     Is Non-Hispanic African American: No     Diabetic: No     Tobacco smoker:  Yes     Systolic Blood Pressure: 485 mmHg     Is BP treated: No     HDL Cholesterol: 62.2 mg/dL     Total Cholesterol: 231 mg/dL       Health maintenance examination - Primary    Preventative protocols reviewed and updated unless pt declined. Discussed healthy diet and lifestyle.        Other Visit Diagnoses    Special screening for malignant neoplasms, colon       Relevant Orders   Ambulatory referral to Gastroenterology       Meds ordered this encounter  Medications  . fexofenadine (ALLEGRA) 180 MG tablet    Sig: Take 1 tablet (180 mg total) by mouth  daily.    Dispense:  30 tablet    Refill:  11   Orders Placed This Encounter  Procedures  . Ambulatory referral to Gastroenterology    Referral Priority:   Routine    Referral Type:   Consultation    Referral Reason:   Specialty Services Required    Number of Visits Requested:   1    Follow up plan: Return in about 1 year (around 05/27/2019) for annual exam, prior fasting for blood work.  Ria Bush, MD

## 2018-06-01 ENCOUNTER — Telehealth: Payer: Self-pay | Admitting: Gastroenterology

## 2018-06-01 NOTE — Telephone Encounter (Signed)
Gastroenterology Pre-Procedure Review  Request Date: 06/28/18   [facility] Requesting Physician: Dr. Vicente Males  PATIENT REVIEW QUESTIONS: The patient responded to the following health history questions as indicated:    1. Are you having any GI issues? no 2. Do you have a personal history of Polyps? no 3. Do you have a family history of Colon Cancer or Polyps? no 4. Diabetes Mellitus? no 5. Joint replacements in the past 12 months?no 6. Major health problems in the past 3 months?no 7. Any artificial heart valves, MVP, or defibrillator?no    MEDICATIONS & ALLERGIES:    Patient reports the following regarding taking any anticoagulation/antiplatelet therapy:   Plavix, Coumadin, Eliquis, Xarelto, Lovenox, Pradaxa, Brilinta, or Effient? no Aspirin? no  Patient confirms/reports the following medications:  Current Outpatient Medications  Medication Sig Dispense Refill  . fexofenadine (ALLEGRA) 180 MG tablet Take 1 tablet (180 mg total) by mouth daily. 30 tablet 11   No current facility-administered medications for this visit.     Patient confirms/reports the following allergies:  Allergies  Allergen Reactions  . Molds & Smuts Other (See Comments)    Sneezing, watery eyes, nasal congestion  . Pollen Extract Other (See Comments)    Sneezing, watery eyes, nasal congestion  . Penicillins Nausea Only    No orders of the defined types were placed in this encounter.   AUTHORIZATION INFORMATION Primary Insurance: 1D#: Group #:  Secondary Insurance: 1D#: Group #:  SCHEDULE INFORMATION: Date: 06/28/18    Vicente Males Time: Location: [place]

## 2018-06-02 ENCOUNTER — Other Ambulatory Visit: Payer: Self-pay

## 2018-06-02 DIAGNOSIS — Z1211 Encounter for screening for malignant neoplasm of colon: Secondary | ICD-10-CM

## 2018-06-25 ENCOUNTER — Encounter: Payer: Self-pay | Admitting: Emergency Medicine

## 2018-06-28 ENCOUNTER — Ambulatory Visit: Payer: Managed Care, Other (non HMO) | Admitting: Certified Registered Nurse Anesthetist

## 2018-06-28 ENCOUNTER — Encounter
Admission: RE | Disposition: A | Discharge status: Home / Self Care | Payer: Self-pay | Source: Ambulatory Visit | Attending: Gastroenterology

## 2018-06-28 ENCOUNTER — Ambulatory Visit
Admission: RE | Admit: 2018-06-28 | Discharge: 2018-06-28 | Disposition: A | Discharge status: Home / Self Care | Payer: Managed Care, Other (non HMO) | Source: Ambulatory Visit | Attending: Gastroenterology | Admitting: Gastroenterology

## 2018-06-28 ENCOUNTER — Encounter: Payer: Self-pay | Admitting: *Deleted

## 2018-06-28 DIAGNOSIS — D122 Benign neoplasm of ascending colon: Secondary | ICD-10-CM | POA: Diagnosis not present

## 2018-06-28 DIAGNOSIS — Z1211 Encounter for screening for malignant neoplasm of colon: Secondary | ICD-10-CM

## 2018-06-28 DIAGNOSIS — E7800 Pure hypercholesterolemia, unspecified: Secondary | ICD-10-CM | POA: Diagnosis not present

## 2018-06-28 DIAGNOSIS — Z79899 Other long term (current) drug therapy: Secondary | ICD-10-CM | POA: Insufficient documentation

## 2018-06-28 DIAGNOSIS — F17200 Nicotine dependence, unspecified, uncomplicated: Secondary | ICD-10-CM | POA: Diagnosis not present

## 2018-06-28 HISTORY — PX: COLONOSCOPY WITH PROPOFOL: SHX5780

## 2018-06-28 SURGERY — COLONOSCOPY WITH PROPOFOL
Anesthesia: General

## 2018-06-28 MED ORDER — PROPOFOL 500 MG/50ML IV EMUL
INTRAVENOUS | Status: DC | PRN
  Administered 2018-06-28: 175 ug/kg/min via INTRAVENOUS

## 2018-06-28 MED ORDER — SODIUM CHLORIDE 0.9 % IV SOLN
INTRAVENOUS | Status: DC
  Administered 2018-06-28: 1000 mL via INTRAVENOUS

## 2018-06-28 MED ORDER — PROPOFOL 500 MG/50ML IV EMUL
INTRAVENOUS | Status: AC
  Filled 2018-06-28: qty 50

## 2018-06-28 MED ORDER — LIDOCAINE HCL (PF) 1 % IJ SOLN
INTRAMUSCULAR | Status: AC
  Administered 2018-06-28: 0.3 mL via INTRADERMAL
  Filled 2018-06-28: qty 2

## 2018-06-28 MED ORDER — LIDOCAINE HCL (CARDIAC) PF 100 MG/5ML IV SOSY
PREFILLED_SYRINGE | INTRAVENOUS | Status: DC | PRN
  Administered 2018-06-28: 50 mg via INTRAVENOUS

## 2018-06-28 MED ORDER — PROPOFOL 10 MG/ML IV BOLUS
INTRAVENOUS | Status: DC | PRN
  Administered 2018-06-28: 70 mg via INTRAVENOUS

## 2018-06-28 MED ORDER — LIDOCAINE HCL (PF) 1 % IJ SOLN
2.0000 mL | Freq: Once | INTRAMUSCULAR | Status: AC
  Administered 2018-06-28: 0.3 mL via INTRADERMAL

## 2018-06-28 NOTE — Anesthesia Postprocedure Evaluation (Signed)
Anesthesia Post Note  Patient: Anne Moreno  Procedure(s) Performed: COLONOSCOPY WITH PROPOFOL (N/A )  Patient location during evaluation: PACU Anesthesia Type: General Level of consciousness: awake and alert Pain management: pain level controlled Vital Signs Assessment: post-procedure vital signs reviewed and stable Respiratory status: spontaneous breathing, nonlabored ventilation, respiratory function stable and patient connected to nasal cannula oxygen Cardiovascular status: blood pressure returned to baseline and stable Postop Assessment: no apparent nausea or vomiting Anesthetic complications: no     Last Vitals:  Vitals:   06/28/18 1134 06/28/18 1136  BP:  (!) 111/52  Pulse: 72 65  Resp: 18 18  Temp: (!) 36.1 C 37 C  SpO2: 100% 100%    Last Pain:  Vitals:   06/28/18 1207  TempSrc:   PainSc: 0-No pain                 Molli Barrows

## 2018-06-28 NOTE — Anesthesia Procedure Notes (Signed)
Date/Time: 06/28/2018 11:07 AM Performed by: Johnna Acosta, CRNA Pre-anesthesia Checklist: Patient identified, Emergency Drugs available, Suction available, Patient being monitored and Timeout performed Patient Re-evaluated:Patient Re-evaluated prior to induction Oxygen Delivery Method: Nasal cannula Preoxygenation: Pre-oxygenation with 100% oxygen

## 2018-06-28 NOTE — Transfer of Care (Signed)
Immediate Anesthesia Transfer of Care Note  Patient: Anne Moreno  Procedure(s) Performed: COLONOSCOPY WITH PROPOFOL (N/A )  Patient Location: PACU  Anesthesia Type:General  Level of Consciousness: awake, alert  and oriented  Airway & Oxygen Therapy: Patient Spontanous Breathing and Patient connected to nasal cannula oxygen  Post-op Assessment: Report given to RN and Post -op Vital signs reviewed and stable  Post vital signs: Reviewed and stable  Last Vitals:  Vitals Value Taken Time  BP 111/52 06/28/2018 11:36 AM  Temp 37 C 06/28/2018 11:36 AM  Pulse 66 06/28/2018 11:37 AM  Resp 17 06/28/2018 11:37 AM  SpO2 100 % 06/28/2018 11:37 AM  Vitals shown include unvalidated device data.  Last Pain:  Vitals:   06/28/18 1136  TempSrc: Tympanic  PainSc:          Complications: No apparent anesthesia complications

## 2018-06-28 NOTE — Anesthesia Post-op Follow-up Note (Signed)
Anesthesia QCDR form completed.        

## 2018-06-28 NOTE — Anesthesia Preprocedure Evaluation (Signed)
Anesthesia Evaluation  Patient identified by MRN, date of birth, ID band Patient awake    Reviewed: Allergy & Precautions, H&P , NPO status , Patient's Chart, lab work & pertinent test results, reviewed documented beta blocker date and time   Airway Mallampati: II   Neck ROM: full    Dental  (+) Poor Dentition   Pulmonary neg pulmonary ROS, Current Smoker,    Pulmonary exam normal        Cardiovascular Exercise Tolerance: Good + Peripheral Vascular Disease  negative cardio ROS Normal cardiovascular exam Rhythm:regular Rate:Normal     Neuro/Psych negative neurological ROS  negative psych ROS   GI/Hepatic negative GI ROS, Neg liver ROS,   Endo/Other  negative endocrine ROS  Renal/GU negative Renal ROS  negative genitourinary   Musculoskeletal   Abdominal   Peds  Hematology negative hematology ROS (+)   Anesthesia Other Findings Past Medical History: 05/12/2017: Pure hypercholesterolemia Past Surgical History: No date: ABDOMINAL HYSTERECTOMY 04/2014: BREAST BIOPSY; Right     Comment:  benign 2004: EYE SURGERY; Right     Comment:  double vision 08/2008: FOOT SURGERY; Left 09/2011: HYSTERECTOMY ABDOMINAL WITH SALPINGECTOMY     Comment:  for heavy bleeding, ovaries remain ([facility]) BMI    Body Mass Index:  26.57 kg/m     Reproductive/Obstetrics negative OB ROS                             Anesthesia Physical Anesthesia Plan  ASA: II  Anesthesia Plan: General   Post-op Pain Management:    Induction:   PONV Risk Score and Plan:   Airway Management Planned:   Additional Equipment:   Intra-op Plan:   Post-operative Plan:   Informed Consent: I have reviewed the patients History and Physical, chart, labs and discussed the procedure including the risks, benefits and alternatives for the proposed anesthesia with the patient or authorized representative who has indicated his/her  understanding and acceptance.   Dental Advisory Given  Plan Discussed with: CRNA  Anesthesia Plan Comments:         Anesthesia Quick Evaluation

## 2018-06-28 NOTE — H&P (Signed)
Jonathon Bellows, MD [address] [place], [place], [place], [address] [address], [place], [place], [place], [address] Phone: [phone]  Fax: [phone]  Primary Care Physician:  Ria Bush, MD   Pre-Procedure History & Physical: HPI:  Anne Moreno is a 52 y.o. female is here for an colonoscopy.   Past Medical History:  Diagnosis Date  . Pure hypercholesterolemia 05/12/2017    Past Surgical History:  Procedure Laterality Date  . ABDOMINAL HYSTERECTOMY    . BREAST BIOPSY Right 04/2014   benign  . EYE SURGERY Right 2004   double vision  . FOOT SURGERY Left 08/2008  . HYSTERECTOMY ABDOMINAL WITH SALPINGECTOMY  09/2011   for heavy bleeding, ovaries remain ([facility])    Prior to Admission medications   Medication Sig Start Date End Date Taking? Authorizing Provider  fexofenadine (ALLEGRA) 180 MG tablet Take 1 tablet (180 mg total) by mouth daily. 05/26/18   Ria Bush, MD    Allergies as of 06/02/2018 - Review Complete 05/26/2018  Allergen Reaction Noted  . Molds & smuts Other (See Comments) 02/14/2014  . Pollen extract Other (See Comments) 02/14/2014  . Penicillins Nausea Only 10/08/2011    Family History  Problem Relation Age of Onset  . Stomach cancer Brother 28       adopted - non biological  . Emphysema Maternal Grandmother   . Bladder Cancer Paternal Grandmother   . Cervical cancer Sister 34       half sister    Social History   Socioeconomic History  . Marital status: Divorced    Spouse name: Not on file  . Number of children: Not on file  . Years of education: Not on file  . Highest education level: Not on file  Occupational History  . Not on file  Social Needs  . Financial resource strain: Not on file  . Food insecurity:    Worry: Not on file    Inability: Not on file  . Transportation needs:    Medical: Not on file    Non-medical: Not on file  Tobacco Use  . Smoking status: Current Some Day  Smoker  . Smokeless tobacco: Never Used  Substance and Sexual Activity  . Alcohol use: Yes    Comment: social  . Drug use: No  . Sexual activity: Not on file  Lifestyle  . Physical activity:    Days per week: Not on file    Minutes per session: Not on file  . Stress: Not on file  Relationships  . Social connections:    Talks on phone: Not on file    Gets together: Not on file    Attends religious service: Not on file    Active member of club or organization: Not on file    Attends meetings of clubs or organizations: Not on file    Relationship status: Not on file  . Intimate partner violence:    Fear of current or ex partner: Not on file    Emotionally abused: Not on file    Physically abused: Not on file    Forced sexual activity: Not on file  Other Topics Concern  . 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 Neck:  Supple; no masses or thyromegaly. Lungs:  Clear throughout to auscultation, normal respiratory effort.    Heart:  +S1, +S2, Regular rate and rhythm, No edema. Abdomen:  Soft, nontender and nondistended. Normal bowel sounds, without guarding, and without rebound.   Neurologic:  Alert and  oriented x4;  grossly normal neurologically.  Impression/Plan: Anne Moreno is here for an colonoscopy to be performed for Screening colonoscopy average risk   Risks, benefits, limitations, and alternatives regarding  colonoscopy  have been reviewed with the patient.  Questions have been answered.  All parties agreeable.   Jonathon Bellows, MD  06/28/2018, 10:59 AM

## 2018-06-28 NOTE — Op Note (Signed)
[facility] Gastroenterology Patient Name: Anne Moreno Procedure Date: 06/28/2018 10:59 AM MRN: [id number] Account #: [id number] Date of Birth: 1966-10-14 Admit Type: Outpatient Age: 52 Room: [facility] ENDO ROOM 4 Gender: Female Note Status: Finalized Procedure:            Colonoscopy Indications:          Screening for colorectal malignant neoplasm Providers:            Jonathon Bellows MD, MD Referring MD:         Ria Bush (Referring MD) Medicines:            Monitored Anesthesia Care Complications:        No immediate complications. Procedure:            Pre-Anesthesia Assessment:                       - Prior to the procedure, a History and Physical was                        performed, and patient medications, allergies and                        sensitivities were reviewed. The patient's tolerance of                        previous anesthesia was reviewed.                       - The risks and benefits of the procedure and the                        sedation options and risks were discussed with the                        patient. All questions were answered and informed                        consent was obtained.                       - ASA Grade Assessment: II - A patient with mild                        systemic disease.                       After obtaining informed consent, the colonoscope was                        passed under direct vision. Throughout the procedure,                        the patient's blood pressure, pulse, and oxygen                        saturations were monitored continuously. The                        Colonoscope was introduced through the anus and  advanced to the the cecum, identified by the                        appendiceal orifice, IC valve and transillumination.                        The colonoscopy was performed with ease. The patient                        tolerated the procedure well. The  quality of the bowel                        preparation was good. Findings:      The perianal and digital rectal examinations were normal.      Two sessile polyps were found in the ascending colon. The polyps were 4       to 5 mm in size. These polyps were removed with a cold snare. Resection       and retrieval were complete.      A 3 mm polyp was found in the ascending colon. The polyp was sessile.       The polyp was removed with a cold biopsy forceps. Resection and       retrieval were complete.      The exam was otherwise without abnormality on direct and retroflexion       views. Impression:           - Two 4 to 5 mm polyps in the ascending colon, removed                        with a cold snare. Resected and retrieved.                       - One 3 mm polyp in the ascending colon, removed with a                        cold biopsy forceps. Resected and retrieved.                       - The examination was otherwise normal on direct and                        retroflexion views. Recommendation:       - Discharge patient to home (with escort).                       - Resume previous diet.                       - Continue present medications.                       - Await pathology results.                       - Repeat colonoscopy in 3 - 5 years for surveillance                        based on pathology results. Procedure Code(s):    --- Professional ---  45385, Colonoscopy, flexible; with removal of tumor(s),                        polyp(s), or other lesion(s) by snare technique                       45380, 67, Colonoscopy, flexible; with biopsy, single                        or multiple Diagnosis Code(s):    --- Professional ---                       D12.2, Benign neoplasm of ascending colon                       Z12.11, Encounter for screening for malignant neoplasm                        of colon CPT copyright 2017 American Medical Association. All  rights reserved. The codes documented in this report are preliminary and upon coder review may  be revised to meet current compliance requirements. Jonathon Bellows, MD Jonathon Bellows MD, MD 06/28/2018 11:34:08 AM This report has been signed electronically. Number of Addenda: 0 Note Initiated On: 06/28/2018 10:59 AM Scope Withdrawal Time: 0 hours 15 minutes 16 seconds  Total Procedure Duration: 0 hours 19 minutes 3 seconds       [facility]

## 2018-06-29 ENCOUNTER — Encounter: Payer: Self-pay | Admitting: Gastroenterology

## 2018-06-29 LAB — SURGICAL PATHOLOGY

## 2018-07-01 ENCOUNTER — Encounter: Payer: Self-pay | Admitting: Family Medicine

## 2018-08-02 ENCOUNTER — Encounter: Payer: Self-pay | Admitting: Family Medicine

## 2019-05-25 ENCOUNTER — Other Ambulatory Visit: Payer: Self-pay

## 2019-05-25 ENCOUNTER — Other Ambulatory Visit (INDEPENDENT_AMBULATORY_CARE_PROVIDER_SITE_OTHER): Payer: 59

## 2019-05-25 ENCOUNTER — Telehealth: Payer: Self-pay

## 2019-05-25 ENCOUNTER — Other Ambulatory Visit: Payer: Self-pay | Admitting: Family Medicine

## 2019-05-25 DIAGNOSIS — E7800 Pure hypercholesterolemia, unspecified: Secondary | ICD-10-CM

## 2019-05-25 LAB — LIPID PANEL
Cholesterol: 243 mg/dL — ABNORMAL HIGH (ref 0–200)
HDL: 59.4 mg/dL (ref >39.00)
LDL Cholesterol: 159 mg/dL — ABNORMAL HIGH (ref 0–99)
NonHDL: 183.47
Total CHOL/HDL Ratio: 4
Triglycerides: 120 mg/dL (ref 0.0–149.0)
VLDL: 24 mg/dL (ref 0.0–40.0)

## 2019-05-25 LAB — COMPREHENSIVE METABOLIC PANEL
ALT: 8 U/L (ref 0–35)
AST: 14 U/L (ref 0–37)
Albumin: 4.6 g/dL (ref 3.5–5.2)
Alkaline Phosphatase: 52 U/L (ref 39–117)
BUN: 11 mg/dL (ref 6–23)
CO2: 30 mEq/L (ref 19–32)
Calcium: 10 mg/dL (ref 8.4–10.5)
Chloride: 103 mEq/L (ref 96–112)
Creatinine, Ser: 0.86 mg/dL (ref 0.40–1.20)
GFR: 68.91 mL/min (ref >60.00)
Glucose, Bld: 78 mg/dL (ref 70–99)
Potassium: 4.2 mEq/L (ref 3.5–5.1)
Sodium: 138 mEq/L (ref 135–145)
Total Bilirubin: 0.7 mg/dL (ref 0.2–1.2)
Total Protein: 7.1 g/dL (ref 6.0–8.3)

## 2019-05-25 NOTE — Telephone Encounter (Signed)
[facility] Night - Client Nonclinical Telephone Record AccessNurse Client [facility] Night - Client Client Site [place] - Night Contact Type Call Who Is Calling Patient / Member / Family / Caregiver Caller Name Dena Esperanza Caller Phone Number [phone] Patient Name Margareta Laureano Patient DOB 1966/08/29 Call Type Message Only Information Provided Reason for Call Request for General Office Information Initial Comment Caller states she is outside the lab door for her lab work today at 7:30. Additional Comment Caller states she will call back if needed. Call Closed By: Jamal Maes Transaction Date/Time: 05/25/2019 7:16:14 AM (ET)

## 2019-05-25 NOTE — Telephone Encounter (Signed)
Per lab tab labs have been drawn.

## 2019-05-30 ENCOUNTER — Other Ambulatory Visit: Payer: Self-pay

## 2019-05-30 ENCOUNTER — Telehealth (INDEPENDENT_AMBULATORY_CARE_PROVIDER_SITE_OTHER): Payer: 59 | Admitting: Family Medicine

## 2019-05-30 ENCOUNTER — Encounter: Payer: Self-pay | Admitting: Family Medicine

## 2019-05-30 VITALS — Ht 63.0 in | Wt 151.0 lb

## 2019-05-30 DIAGNOSIS — G2581 Restless legs syndrome: Secondary | ICD-10-CM

## 2019-05-30 DIAGNOSIS — Z0000 Encounter for general adult medical examination without abnormal findings: Secondary | ICD-10-CM | POA: Diagnosis not present

## 2019-05-30 DIAGNOSIS — Z1239 Encounter for other screening for malignant neoplasm of breast: Secondary | ICD-10-CM

## 2019-05-30 DIAGNOSIS — E7800 Pure hypercholesterolemia, unspecified: Secondary | ICD-10-CM | POA: Diagnosis not present

## 2019-05-30 DIAGNOSIS — R232 Flushing: Secondary | ICD-10-CM

## 2019-05-30 NOTE — Assessment & Plan Note (Addendum)
Chronic off meds. Requests low chol diet handout - will mail to patient.  The 10-year ASCVD risk score Mikey Bussing DC Brooke Bonito., et al., 2013) is: 1.4%   Values used to calculate the score:     Age: 53 years     Sex: Female     Is Non-Hispanic African American: No     Diabetic: No     Tobacco smoker: No     Systolic Blood Pressure: 889 mmHg     Is BP treated: No     HDL Cholesterol: 59.4 mg/dL     Total Cholesterol: 243 mg/dL

## 2019-05-30 NOTE — Assessment & Plan Note (Signed)
Today describes more restless leg symptoms - will return for iron panel and if normal consider requip.

## 2019-05-30 NOTE — Assessment & Plan Note (Signed)
Preventative protocols reviewed and updated unless pt declined. Discussed healthy diet and lifestyle.  

## 2019-05-30 NOTE — Assessment & Plan Note (Addendum)
Menopause related.

## 2019-05-30 NOTE — Progress Notes (Signed)
Virtual visit completed through [facility]. Due to national recommendations of social distancing due to COVID-19, a virtual visit is felt to be most appropriate for this patient at this time. Reviewed limitations of a virtual visit.   Patient location: home Provider location: [place] at [facility], office If any vitals were documented, they were collected by patient at home unless specified below.    Ht 5\' 3"  (1.6 m)   Wt 151 lb (68.5 kg)   BMI 26.75 kg/m   BP Readings from Last 3 Encounters:  06/28/18 (!) 111/52  05/26/18 122/68  04/19/18 118/80    CC: CPE Subjective:    Patient ID: Anne Moreno, female    DOB: December 19, 1965, 53 y.o.   MRN: [id number]  HPI: Anne Moreno is a 53 y.o. female presenting on 05/30/2019 for Annual Exam   She has been working from home.  Endorses RLS - sensation of leg movement when legs are not moving. Not getting deep sleep according to fit bit.   Preventative: COLONOSCOPY WITH PROPOFOL 06/28/2018 - TA x3, rpt 3 yrs Vicente Males, Bailey Mech, MD) Well woman - OBGYN [facility]), last pelvic exam 04/2016. S/p hysterectomy 2012 for heavy bleeding, ovaries remain. Menopausal symptoms - hot flashes and sleep disturbances.  Mammogram - overdue - will order Flu shot - doesn't receive.  Td 1996, Tdap 2018 Seat belt use discussed.  Sunscreen use discussed. No changing moles on skin.  Ex smoker. Quit full time smoking age 57yo. 7.5 PY hx.  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 Interim medical history since our last visit reviewed. Allergies and medications  reviewed and updated. Outpatient Medications Prior to Visit  Medication Sig Dispense Refill  . fexofenadine (ALLEGRA) 180 MG tablet Take 1 tablet (180 mg total) by mouth daily. 30 tablet 11  . Multiple Vitamin (MULTIVITAMIN PO) Take by mouth daily.     No facility-administered medications prior to visit.      Per HPI unless specifically indicated in ROS section below Review of Systems  Constitutional: Negative for activity change, appetite change, chills, fatigue, fever and unexpected weight change.  HENT: Negative for hearing loss.   Eyes: Negative for visual disturbance.  Respiratory: Negative for cough, chest tightness, shortness of breath and wheezing.   Cardiovascular: Negative for chest pain, palpitations and leg swelling.  Gastrointestinal: Negative for abdominal distention, abdominal pain, blood in stool, constipation, diarrhea, nausea and vomiting.  Genitourinary: Negative for difficulty urinating and hematuria.  Musculoskeletal: Negative for arthralgias, myalgias and neck pain.  Skin: Negative for rash.  Neurological: Negative for dizziness, seizures, syncope and headaches.  Hematological: Negative for adenopathy. Does not bruise/bleed easily.  Psychiatric/Behavioral: Negative for dysphoric mood. The patient is not nervous/anxious.    Objective:    Ht 5\' 3"  (1.6 m)   Wt 151 lb (68.5 kg)   BMI 26.75 kg/m   Wt Readings from Last 3 Encounters:  05/30/19 151 lb (68.5 kg)  06/28/18 150 lb (68 kg)  05/26/18 149 lb 12 oz (67.9 kg)     Physical exam: Gen: alert, NAD, not ill appearing Pulm: speaks in complete  sentences without increased work of breathing Psych: normal mood, normal thought content      Results for orders placed or performed in visit on 05/25/19  Lipid panel  Result Value Ref Range   Cholesterol 243 (H) 0 - 200 mg/dL   Triglycerides 120.0 0.0 - 149.0 mg/dL   HDL 59.40 >39.00 mg/dL   VLDL 24.0 0.0 - 40.0 mg/dL   LDL Cholesterol 159 (H) 0 - 99 mg/dL    Total CHOL/HDL Ratio 4    NonHDL 183.47   Comprehensive metabolic panel  Result Value Ref Range   Sodium 138 135 - 145 mEq/L   Potassium 4.2 3.5 - 5.1 mEq/L   Chloride 103 96 - 112 mEq/L   CO2 30 19 - 32 mEq/L   Glucose, Bld 78 70 - 99 mg/dL   BUN 11 6 - 23 mg/dL   Creatinine, Ser 0.86 0.40 - 1.20 mg/dL   Total Bilirubin 0.7 0.2 - 1.2 mg/dL   Alkaline Phosphatase 52 39 - 117 U/L   AST 14 0 - 37 U/L   ALT 8 0 - 35 U/L   Total Protein 7.1 6.0 - 8.3 g/dL   Albumin 4.6 3.5 - 5.2 g/dL   Calcium 10.0 8.4 - 10.5 mg/dL   GFR 68.91 >60.00 mL/min   Assessment & Plan:   Problem List Items Addressed This Visit    RLS (restless legs syndrome)    Today describes more restless leg symptoms - will return for iron panel and if normal consider requip.       Relevant Orders   IBC panel   Ferritin   Pure hypercholesterolemia    Chronic off meds. Requests low chol diet handout - will mail to patient.  The 10-year ASCVD risk score Mikey Bussing DC Brooke Bonito., et al., 2013) is: 1.4%   Values used to calculate the score:     Age: 81 years     Sex: Female     Is Non-Hispanic African American: No     Diabetic: No     Tobacco smoker: No     Systolic Blood Pressure: 902 mmHg     Is BP treated: No     HDL Cholesterol: 59.4 mg/dL     Total Cholesterol: 243 mg/dL       Hot flashes    Menopause related.       Health maintenance examination - Primary    Preventative protocols reviewed and updated unless pt declined. Discussed healthy diet and lifestyle.        Other Visit Diagnoses    Breast cancer screening       Relevant Orders   MM 3D SCREEN BREAST BILATERAL       No orders of the defined types were placed in this encounter.  Orders Placed This Encounter  Procedures  . MM 3D SCREEN BREAST BILATERAL    Standing Status:   Future    Standing Expiration Date:   07/29/2020    Order Specific Question:   Reason for Exam (SYMPTOM  OR DIAGNOSIS REQUIRED)    Answer:   breast cancer screening    Order  Specific Question:   Is the patient pregnant?    Answer:   No    Order Specific Question:   Preferred imaging location?    Answer:   [facility] panel    Standing Status:   Future    Standing Expiration Date:   05/29/2020  . Ferritin    Standing Status:  Future    Standing Expiration Date:   05/29/2020    I discussed the assessment and treatment plan with the patient. The patient was provided an opportunity to ask questions and all were answered. The patient agreed with the plan and demonstrated an understanding of the instructions. The patient was advised to call back or seek an in-person evaluation if the symptoms worsen or if the condition fails to improve as anticipated.  Follow up plan: Return in about 1 year (around 05/29/2020) for annual exam, prior fasting for blood work.  Ria Bush, MD

## 2019-06-01 ENCOUNTER — Other Ambulatory Visit (INDEPENDENT_AMBULATORY_CARE_PROVIDER_SITE_OTHER): Payer: 59

## 2019-06-01 ENCOUNTER — Encounter: Payer: Self-pay | Admitting: Family Medicine

## 2019-06-01 ENCOUNTER — Other Ambulatory Visit: Payer: Self-pay

## 2019-06-01 DIAGNOSIS — G2581 Restless legs syndrome: Secondary | ICD-10-CM

## 2019-06-01 LAB — IBC PANEL
Iron: 143 ug/dL (ref 42–145)
Saturation Ratios: 40.7 % (ref 20.0–50.0)
Transferrin: 251 mg/dL (ref 212.0–360.0)

## 2019-06-01 LAB — FERRITIN: Ferritin: 141.3 ng/mL (ref 10.0–291.0)

## 2019-06-01 MED ORDER — FEXOFENADINE HCL 180 MG PO TABS: 180.0000 mg | ORAL_TABLET | Freq: Every day | ORAL | 11 refills | Status: DC

## 2019-06-01 NOTE — Telephone Encounter (Signed)
E-scribed refill.  Notified pt via MyChart.  ?

## 2019-06-03 ENCOUNTER — Encounter: Payer: Self-pay | Admitting: Family Medicine

## 2019-06-03 ENCOUNTER — Other Ambulatory Visit: Payer: Self-pay | Admitting: Family Medicine

## 2019-06-03 MED ORDER — GABAPENTIN 100 MG PO CAPS: 100.0000 mg | ORAL_CAPSULE | Freq: Every day | ORAL | 3 refills | Status: DC

## 2020-06-06 ENCOUNTER — Other Ambulatory Visit: Payer: Self-pay | Admitting: Family Medicine

## 2020-06-24 ENCOUNTER — Other Ambulatory Visit: Payer: Self-pay | Admitting: Family Medicine

## 2020-06-24 DIAGNOSIS — E7800 Pure hypercholesterolemia, unspecified: Secondary | ICD-10-CM

## 2020-06-24 DIAGNOSIS — Z1159 Encounter for screening for other viral diseases: Secondary | ICD-10-CM

## 2020-06-27 ENCOUNTER — Other Ambulatory Visit (INDEPENDENT_AMBULATORY_CARE_PROVIDER_SITE_OTHER): Payer: 59

## 2020-06-27 ENCOUNTER — Other Ambulatory Visit: Payer: Self-pay

## 2020-06-27 DIAGNOSIS — E7800 Pure hypercholesterolemia, unspecified: Secondary | ICD-10-CM

## 2020-06-27 DIAGNOSIS — Z1159 Encounter for screening for other viral diseases: Secondary | ICD-10-CM

## 2020-06-27 LAB — BASIC METABOLIC PANEL
BUN: 16 mg/dL (ref 6–23)
CO2: 29 mEq/L (ref 19–32)
Calcium: 9.9 mg/dL (ref 8.4–10.5)
Chloride: 102 mEq/L (ref 96–112)
Creatinine, Ser: 0.88 mg/dL (ref 0.40–1.20)
GFR: 66.83 mL/min (ref >60.00)
Glucose, Bld: 88 mg/dL (ref 70–99)
Potassium: 4.5 mEq/L (ref 3.5–5.1)
Sodium: 139 mEq/L (ref 135–145)

## 2020-06-27 LAB — LIPID PANEL
Cholesterol: 232 mg/dL — ABNORMAL HIGH (ref 0–200)
HDL: 60 mg/dL (ref >39.00)
LDL Cholesterol: 143 mg/dL — ABNORMAL HIGH (ref 0–99)
NonHDL: 172.34
Total CHOL/HDL Ratio: 4
Triglycerides: 146 mg/dL (ref 0.0–149.0)
VLDL: 29.2 mg/dL (ref 0.0–40.0)

## 2020-06-27 LAB — HEPATIC FUNCTION PANEL
ALT: 8 U/L (ref 0–35)
AST: 14 U/L (ref 0–37)
Albumin: 4.6 g/dL (ref 3.5–5.2)
Alkaline Phosphatase: 53 U/L (ref 39–117)
Bilirubin, Direct: 0.1 mg/dL (ref 0.0–0.3)
Total Bilirubin: 1 mg/dL (ref 0.2–1.2)
Total Protein: 7.1 g/dL (ref 6.0–8.3)

## 2020-06-28 LAB — HEPATITIS C ANTIBODY
Hepatitis C Ab: NONREACTIVE
SIGNAL TO CUT-OFF: 0.01 (ref <1.00)

## 2020-07-04 ENCOUNTER — Other Ambulatory Visit: Payer: Self-pay

## 2020-07-04 ENCOUNTER — Encounter: Payer: Self-pay | Admitting: Family Medicine

## 2020-07-04 ENCOUNTER — Ambulatory Visit (INDEPENDENT_AMBULATORY_CARE_PROVIDER_SITE_OTHER): Payer: 59 | Admitting: Family Medicine

## 2020-07-04 VITALS — BP 122/82 | HR 69 | Temp 97.9°F | Ht 63.0 in | Wt 149.5 lb

## 2020-07-04 DIAGNOSIS — J302 Other seasonal allergic rhinitis: Secondary | ICD-10-CM

## 2020-07-04 DIAGNOSIS — J309 Allergic rhinitis, unspecified: Secondary | ICD-10-CM | POA: Insufficient documentation

## 2020-07-04 DIAGNOSIS — E7800 Pure hypercholesterolemia, unspecified: Secondary | ICD-10-CM | POA: Diagnosis not present

## 2020-07-04 DIAGNOSIS — Z0000 Encounter for general adult medical examination without abnormal findings: Secondary | ICD-10-CM | POA: Diagnosis not present

## 2020-07-04 DIAGNOSIS — G2581 Restless legs syndrome: Secondary | ICD-10-CM | POA: Diagnosis not present

## 2020-07-04 MED ORDER — ROPINIROLE HCL 0.25 MG PO TABS: 0.2500 mg | ORAL_TABLET | Freq: Every day | ORAL | 1 refills | Status: DC

## 2020-07-04 NOTE — Assessment & Plan Note (Signed)
Ongoing RLS, not improved with gabapentin. Will trial requip 0.25mg  nightly with option to titrate. Advised stop and let us know if any uncontrollable movements develop.

## 2020-07-04 NOTE — Assessment & Plan Note (Signed)
Reviewed OTC medications to treat allergic rhinitis, allergen avoidance measures and nasal saline use.

## 2020-07-04 NOTE — Progress Notes (Signed)
This visit was conducted in person.  BP 122/82 (BP Location: Left Arm, Patient Position: Sitting, Cuff Size: Normal)   Pulse 69   Temp 97.9 F (36.6 C) (Temporal)   Ht 5\' 3"  (1.6 m)   Wt 149 lb 8 oz (67.8 kg)   SpO2 100%   BMI 26.48 kg/m    CC: CPE Subjective:    Patient ID: Anne Moreno, female    DOB: Sep 20, 1966, 54 y.o.   MRN: [id number]  HPI: Anne Moreno is a 54 y.o. female presenting on 07/04/2020 for Annual Exam   Notes ongoing allergy symptoms despite daily allegra.  RLS - ongoing symptoms present during day but most bothersome at night time. Gabapentin previously ineffective.   Preventative: COLONOSCOPY WITH PROPOFOL 06/28/2018 - TA x3, rpt 3 yrs Vicente Males, Bailey Mech, MD) Well woman - OBGYN [facility]), last pelvic exam7/2017. S/p hysterectomy 2012 for heavy bleeding, ovaries remain. Menopausal symptoms- hot flashes and sleep disturbances. Never on HRT. Has tried OTC remedies.  Mammogram - overdue - advised she call and schedule. No problems noted.  Flu shot - doesn't receive.  Td 1996, Tdap 2018  COVID vaccine - discussion regarding pros/cons of vaccination, declines.  Shingrix - discussed.  Seat belt use discussed.  Sunscreen use discussed. No changing moles on skin.  Ex smoker. Quit full time smoking age 29yo. 7.5 PY hx.  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 Interim medical history since our last visit reviewed. Allergies and medications reviewed and updated. Outpatient Medications Prior to Visit  Medication Sig Dispense Refill  . fexofenadine (ALLEGRA) 180 MG tablet TAKE  1 TABLET BY MOUTH EVERY DAY 30 tablet 1  . Multiple Vitamin (MULTIVITAMIN PO) Take by mouth daily.    Marland Kitchen gabapentin (NEURONTIN) 100 MG capsule Take 1-3 capsules (100-300 mg total) by mouth at bedtime. 90 capsule 3   No facility-administered medications prior to visit.     Per HPI unless specifically indicated in ROS section below Review of Systems  Constitutional: Negative for activity change, appetite change, chills, fatigue, fever and unexpected weight change.  HENT: Negative for hearing loss.   Eyes: Negative for visual disturbance.  Respiratory: Positive for cough (allergy related). Negative for chest tightness, shortness of breath and wheezing.   Cardiovascular: Negative for chest pain, palpitations and leg swelling.  Gastrointestinal: Negative for abdominal distention, abdominal pain, blood in stool, constipation, diarrhea, nausea and vomiting.  Genitourinary: Negative for difficulty urinating and hematuria.  Musculoskeletal: Negative for arthralgias, myalgias and neck pain.  Skin: Negative for rash.  Neurological: Negative for dizziness, seizures, syncope and headaches.  Hematological: Negative for adenopathy. Does not bruise/bleed easily.  Psychiatric/Behavioral: Negative for dysphoric mood. The patient is not nervous/anxious.    Objective:  BP 122/82 (BP Location: Left Arm, Patient Position: Sitting, Cuff Size: Normal)   Pulse 69   Temp 97.9 F (36.6 C) (Temporal)   Ht 5\' 3"  (1.6 m)   Wt 149 lb 8 oz (67.8 kg)   SpO2 100%   BMI 26.48 kg/m   Wt Readings from Last 3 Encounters:  07/04/20 149 lb 8 oz (67.8 kg)  05/30/19 151 lb (68.5 kg)  06/28/18 150 lb (68 kg)      Physical Exam Vitals and nursing note reviewed.  Constitutional:      General: She is not in acute distress.    Appearance: Normal appearance. She is well-developed. She is not ill-appearing.  HENT:     Head: Normocephalic and atraumatic.     Right Ear: Hearing, tympanic membrane, ear canal and external  ear normal.     Left Ear: Hearing, tympanic membrane, ear canal and external ear normal.  Eyes:     General: No scleral icterus.    Conjunctiva/sclera: Conjunctivae normal.     Pupils: Pupils are equal, round, and reactive to light.  Neck:     Thyroid: No thyroid mass or thyromegaly.  Cardiovascular:     Rate and Rhythm: Normal rate and regular rhythm.     Pulses: Normal pulses.          Radial pulses are 2+ on the right side and 2+ on the left side.     Heart sounds: Normal heart sounds. No murmur heard.   Pulmonary:     Effort: Pulmonary effort is normal. No respiratory distress.     Breath sounds: Normal breath sounds. No wheezing, rhonchi or rales.  Abdominal:     General: Abdomen is flat. Bowel sounds are normal. There is no distension.     Palpations: Abdomen is soft. There is no mass.     Tenderness: There is no abdominal tenderness. There is no guarding or rebound.     Hernia: No hernia is present.  Musculoskeletal:        General: Normal range of motion.     Cervical back: Normal range of motion and neck supple.     Right lower leg: No edema.     Left lower leg: No edema.  Lymphadenopathy:     Cervical: No cervical adenopathy.  Skin:    General: Skin is warm and dry.     Findings: No rash.  Neurological:     General: No focal deficit present.     Mental Status: She is alert and oriented to person, place, and time.     Comments: CN grossly intact, station and gait intact  Psychiatric:        Mood and Affect: Mood normal.        Behavior: Behavior normal.        Thought Content: Thought content normal.        Judgment: Judgment normal.       Results for orders placed or performed in visit on 06/27/20  Hepatic function panel  Result Value Ref Range   Total Bilirubin 1.0 0.2 - 1.2 mg/dL   Bilirubin, Direct 0.1 0.0 - 0.3 mg/dL   Alkaline Phosphatase 53 39 - 117 U/L   AST 14 0 - 37 U/L   ALT 8 0 - 35 U/L   Total Protein 7.1 6.0 - 8.3 g/dL   Albumin 4.6 3.5 -  5.2 g/dL  Hepatitis C antibody  Result Value Ref Range   Hepatitis C Ab NON-REACTIVE NON-REACTI   SIGNAL TO CUT-OFF 0.01 <6.44  Basic metabolic panel  Result Value Ref Range   Sodium 139 135 - 145 mEq/L   Potassium 4.5 3.5 - 5.1 mEq/L   Chloride 102 96 - 112 mEq/L   CO2 29 19 - 32 mEq/L   Glucose, Bld 88 70 - 99 mg/dL   BUN 16 6 - 23 mg/dL   Creatinine,  Ser 0.88 0.40 - 1.20 mg/dL   GFR 66.83 >60.00 mL/min   Calcium 9.9 8.4 - 10.5 mg/dL  Lipid panel  Result Value Ref Range   Cholesterol 232 (H) 0 - 200 mg/dL   Triglycerides 146.0 0 - 149 mg/dL   HDL 60.00 >39.00 mg/dL   VLDL 29.2 0.0 - 40.0 mg/dL   LDL Cholesterol 143 (H) 0 - 99 mg/dL   Total CHOL/HDL Ratio 4    NonHDL 172.34    Assessment & Plan:  This visit occurred during the SARS-CoV-2 public health emergency.  Safety protocols were in place, including screening questions prior to the visit, additional usage of staff PPE, and extensive cleaning of exam room while observing appropriate contact time as indicated for disinfecting solutions.   Problem List Items Addressed This Visit    RLS (restless legs syndrome)    Ongoing RLS, not improved with gabapentin. Will trial requip 0.25mg  nightly with option to titrate. Advised stop and let us know if any uncontrollable movements develop.       Pure hypercholesterolemia    Chronic, off meds. Reviewed ASCVD risks core with patient. Encourage continued low chol diet.  The 10-year ASCVD risk score Mikey Bussing DC Brooke Bonito., et al., 2013) is: 1.8%   Values used to calculate the score:     Age: 44 years     Sex: Female     Is Non-Hispanic African American: No     Diabetic: No     Tobacco smoker: No     Systolic Blood Pressure: 219 mmHg     Is BP treated: No     HDL Cholesterol: 60 mg/dL     Total Cholesterol: 232 mg/dL       Health maintenance examination - Primary    Preventative protocols reviewed and updated unless pt declined. Discussed healthy diet and lifestyle.  I encouraged COVID  vaccine - she declines.       Allergic rhinitis    Reviewed OTC medications to treat allergic rhinitis, allergen avoidance measures and nasal saline use.           Meds ordered this encounter  Medications  . rOPINIRole (REQUIP) 0.25 MG tablet    Sig: Take 1 tablet (0.25 mg total) by mouth at bedtime. For RLS symptoms    Dispense:  30 tablet    Refill:  1   No orders of the defined types were placed in this encounter.   Patient instructions: For allergies - try different antihistamine brand instead of allegra (like claritin, xyzal, or zyrtec). If not effective, start nasal steroid (flonase or rhinocort). May use nasal saline irrigation as needed.  Call to schedule mammogram at your convenience: [facility] at [facility] [phone] Consider shingles shot.  Try requip at night time for restless leg symptoms. Update me with effect.  Return as needed or in 1 year for next physical.   Follow up plan: Return in about 1 year (around 07/04/2021) for annual exam, prior fasting for blood work.  Ria Bush, MD

## 2020-07-04 NOTE — Assessment & Plan Note (Addendum)
Chronic, off meds. Reviewed ASCVD risks core with patient. Encourage continued low chol diet.  The 10-year ASCVD risk score Anne Moreno., et al., 2013) is: 1.8%   Values used to calculate the score:     Age: 54 years     Sex: Female     Is Non-Hispanic African American: No     Diabetic: No     Tobacco smoker: No     Systolic Blood Pressure: 037 mmHg     Is BP treated: No     HDL Cholesterol: 60 mg/dL     Total Cholesterol: 232 mg/dL

## 2020-07-04 NOTE — Assessment & Plan Note (Addendum)
Preventative protocols reviewed and updated unless pt declined. Discussed healthy diet and lifestyle.  I encouraged COVID vaccine - she declines.

## 2020-07-04 NOTE — Patient Instructions (Addendum)
For allergies - try different antihistamine brand instead of allegra (like claritin, xyzal, or zyrtec). If not effective, start nasal steroid (flonase or rhinocort). May use nasal saline irrigation as needed.  Call to schedule mammogram at your convenience: [facility] at [facility] [phone] Consider shingles shot.  Try requip at night time for restless leg symptoms. Update me with effect.  Return as needed or in 1 year for next physical.   Restless Legs Syndrome Restless legs syndrome is a condition that causes uncomfortable feelings or sensations in the legs, especially while sitting or lying down. The sensations usually cause an overwhelming urge to move the legs. The arms can also sometimes be affected. The condition can range from mild to severe. The symptoms often interfere with a person's ability to sleep. What are the causes? The cause of this condition is not known. What increases the risk? The following factors may make you more likely to develop this condition:  Being older than 50.  Pregnancy.  Being a woman. In general, the condition is more common in women than in men.  A family history of the condition.  Having iron deficiency.  Overuse of caffeine, nicotine, or alcohol.  Certain medical conditions, such as kidney disease, Parkinson's disease, or nerve damage.  Certain medicines, such as those for high blood pressure, nausea, colds, allergies, depression, and some heart conditions. What are the signs or symptoms? The main symptom of this condition is uncomfortable sensations in the legs, such as:  Pulling.  Tingling.  Prickling.  Throbbing.  Crawling.  Burning. Usually, the sensations:  Affect both sides of the body.  Are worse when you sit or lie down.  Are worse at night. These may wake you up or make it difficult to fall asleep.  Make you have a strong urge to move your legs.  Are temporarily relieved by moving your legs. The arms can  also be affected, but this is rare. People who have this condition often have tiredness during the day because of their lack of sleep at night. How is this diagnosed? This condition may be diagnosed based on:  Your symptoms.  Blood tests. In some cases, you may be monitored in a sleep lab by a specialist (a sleep study). This can detect any disruptions in your sleep. How is this treated? This condition is treated by managing the symptoms. This may include:  Lifestyle changes, such as exercising, using relaxation techniques, and avoiding caffeine, alcohol, or tobacco.  Medicines. Anti-seizure medicines may be tried first. Follow these instructions at home:     General instructions  Take over-the-counter and prescription medicines only as told by your health care provider.  Use methods to help relieve the uncomfortable sensations, such as: ? Massaging your legs. ? Walking or stretching. ? Taking a cold or hot bath.  Keep all follow-up visits as told by your health care provider. This is important. Lifestyle  Practice good sleep habits. For example, go to bed and get up at the same time every day. Most adults should get 7-9 hours of sleep each night.  Exercise regularly. Try to get at least 30 minutes of exercise most days of the week.  Practice ways of relaxing, such as yoga or meditation.  Avoid caffeine and alcohol.  Do not use any products that contain nicotine or tobacco, such as cigarettes and e-cigarettes. If you need help quitting, ask your health care provider. Contact a health care provider if:  Your symptoms get worse or they  do not improve with treatment. Summary  Restless legs syndrome is a condition that causes uncomfortable feelings or sensations in the legs, especially while sitting or lying down.  The symptoms often interfere with a person's ability to sleep.  This condition is treated by managing the symptoms. You may need to make lifestyle changes or  take medicines. This information is not intended to replace advice given to you by your health care provider. Make sure you discuss any questions you have with your health care provider. Document Revised: 10/26/2017 Document Reviewed: 10/26/2017 Elsevier Patient Education  [place].

## 2020-07-10 ENCOUNTER — Other Ambulatory Visit: Payer: Self-pay | Admitting: Family Medicine

## 2020-07-10 DIAGNOSIS — Z1231 Encounter for screening mammogram for malignant neoplasm of breast: Secondary | ICD-10-CM

## 2020-07-27 ENCOUNTER — Other Ambulatory Visit: Payer: Self-pay | Admitting: Family Medicine

## 2020-07-30 ENCOUNTER — Other Ambulatory Visit: Payer: Self-pay

## 2020-07-30 ENCOUNTER — Ambulatory Visit
Admission: RE | Admit: 2020-07-30 | Discharge: 2020-07-30 | Disposition: A | Discharge status: Home / Self Care | Payer: 59 | Source: Ambulatory Visit | Attending: Family Medicine | Admitting: Family Medicine

## 2020-07-30 DIAGNOSIS — Z1231 Encounter for screening mammogram for malignant neoplasm of breast: Secondary | ICD-10-CM | POA: Diagnosis not present

## 2020-07-31 ENCOUNTER — Other Ambulatory Visit: Payer: Self-pay | Admitting: Family Medicine

## 2020-07-31 DIAGNOSIS — R921 Mammographic calcification found on diagnostic imaging of breast: Secondary | ICD-10-CM

## 2020-07-31 DIAGNOSIS — R928 Other abnormal and inconclusive findings on diagnostic imaging of breast: Secondary | ICD-10-CM

## 2020-08-03 ENCOUNTER — Other Ambulatory Visit: Payer: Self-pay | Admitting: Family Medicine

## 2020-08-03 NOTE — Telephone Encounter (Signed)
Refill left on vm at pharmacy.  

## 2020-08-15 ENCOUNTER — Other Ambulatory Visit: Payer: Self-pay

## 2020-08-15 ENCOUNTER — Ambulatory Visit
Admission: RE | Admit: 2020-08-15 | Discharge: 2020-08-15 | Disposition: A | Discharge status: Home / Self Care | Payer: 59 | Source: Ambulatory Visit | Attending: Family Medicine | Admitting: Family Medicine

## 2020-08-15 DIAGNOSIS — R928 Other abnormal and inconclusive findings on diagnostic imaging of breast: Secondary | ICD-10-CM | POA: Insufficient documentation

## 2020-08-15 DIAGNOSIS — R921 Mammographic calcification found on diagnostic imaging of breast: Secondary | ICD-10-CM

## 2020-08-22 ENCOUNTER — Other Ambulatory Visit: Payer: Self-pay | Admitting: Family Medicine

## 2020-08-22 NOTE — Telephone Encounter (Signed)
Requip Last filled:  07/27/20, #30 Last OV:  07/04/20, CPE Next OV:  07/05/21, CPE

## 2020-11-26 ENCOUNTER — Other Ambulatory Visit: Payer: Self-pay | Admitting: Family Medicine

## 2020-11-27 NOTE — Telephone Encounter (Signed)
Pharmacy requests refill on: Ropinirole 0.25 mg   LAST REFILL: 08/28/2020 (Q-30, R-3) LAST OV: 07/04/2020 NEXT OV: 07/05/2021 PHARMACY: [id number] [place], [place]   Earliest Fill Date: 12/26/2020

## 2021-01-12 ENCOUNTER — Other Ambulatory Visit: Payer: Self-pay | Admitting: Family Medicine

## 2021-06-24 ENCOUNTER — Other Ambulatory Visit: Payer: Self-pay | Admitting: Family Medicine

## 2021-06-24 DIAGNOSIS — E7800 Pure hypercholesterolemia, unspecified: Secondary | ICD-10-CM

## 2021-06-28 ENCOUNTER — Other Ambulatory Visit: Payer: 59

## 2021-07-05 ENCOUNTER — Ambulatory Visit (INDEPENDENT_AMBULATORY_CARE_PROVIDER_SITE_OTHER): Payer: 59 | Admitting: Family Medicine

## 2021-07-05 ENCOUNTER — Other Ambulatory Visit: Payer: Self-pay

## 2021-07-05 ENCOUNTER — Encounter: Payer: Self-pay | Admitting: Family Medicine

## 2021-07-05 VITALS — BP 130/82 | HR 75 | Temp 97.7°F | Ht 63.0 in | Wt 144.0 lb

## 2021-07-05 DIAGNOSIS — Z0000 Encounter for general adult medical examination without abnormal findings: Secondary | ICD-10-CM

## 2021-07-05 DIAGNOSIS — R229 Localized swelling, mass and lump, unspecified: Secondary | ICD-10-CM | POA: Diagnosis not present

## 2021-07-05 DIAGNOSIS — G2581 Restless legs syndrome: Secondary | ICD-10-CM

## 2021-07-05 DIAGNOSIS — E7800 Pure hypercholesterolemia, unspecified: Secondary | ICD-10-CM | POA: Diagnosis not present

## 2021-07-05 LAB — POC URINALSYSI DIPSTICK (AUTOMATED)
Bilirubin, UA: NEGATIVE
Blood, UA: NEGATIVE
Glucose, UA: NEGATIVE
Ketones, UA: NEGATIVE
Leukocytes, UA: NEGATIVE
Nitrite, UA: NEGATIVE
Protein, UA: NEGATIVE
Spec Grav, UA: <=1.005 — AB (ref 1.010–1.025)
Urobilinogen, UA: 0.2 E.U./dL
pH, UA: 6 (ref 5.0–8.0)

## 2021-07-05 NOTE — Assessment & Plan Note (Signed)
Chronic, off meds.  The 10-year ASCVD risk score (Arnett DK, et al., 2019) is: 2.2%   Values used to calculate the score:     Age: 55 years     Sex: Female     Is Non-Hispanic African American: No     Diabetic: No     Tobacco smoker: No     Systolic Blood Pressure: [id number]     Is BP treated: No     HDL Cholesterol: 60 mg/dL     Total Cholesterol: 232 mg/dL

## 2021-07-05 NOTE — Assessment & Plan Note (Signed)
Preventative protocols reviewed and updated unless pt declined. Discussed healthy diet and lifestyle.  

## 2021-07-05 NOTE — Assessment & Plan Note (Signed)
No recent trouble.  

## 2021-07-05 NOTE — Addendum Note (Signed)
Addended by: Brenton Grills on: [id number] PM   Modules accepted: Orders

## 2021-07-05 NOTE — Assessment & Plan Note (Addendum)
Anticipate initial infected epidermal cyst that is now better, but small cyst/nodule persists.

## 2021-07-05 NOTE — Patient Instructions (Addendum)
Check on GYN appointment.  Consider shingles vaccine.  Labs today  Urinalysis today  Good to see you today Return as needed or in 1 year for next physical.   Health Maintenance, Female Adopting a healthy lifestyle and getting preventive care are important in promoting health and wellness. Ask your health care provider about: The right schedule for you to have regular tests and exams. Things you can do on your own to prevent diseases and keep yourself healthy. What should I know about diet, weight, and exercise? Eat a healthy diet  Eat a diet that includes plenty of vegetables, fruits, low-fat dairy products, and lean protein. Do not eat a lot of foods that are high in solid fats, added sugars, or sodium. Maintain a healthy weight Body mass index (BMI) is used to identify weight problems. It estimates body fat based on height and weight. Your health care provider can help determine your BMI and help you achieve or maintain a healthy weight. Get regular exercise Get regular exercise. This is one of the most important things you can do for your health. Most adults should: Exercise for at least 150 minutes each week. The exercise should increase your heart rate and make you sweat (moderate-intensity exercise). Do strengthening exercises at least twice a week. This is in addition to the moderate-intensity exercise. Spend less time sitting. Even light physical activity can be beneficial. Watch cholesterol and blood lipids Have your blood tested for lipids and cholesterol at 55 years of age, then have this test every 5 years. Have your cholesterol levels checked more often if: Your lipid or cholesterol levels are high. You are older than 55 years of age. You are at high risk for heart disease. What should I know about cancer screening? Depending on your health history and family history, you may need to have cancer screening at various ages. This may include screening for: Breast  cancer. Cervical cancer. Colorectal cancer. Skin cancer. Lung cancer. What should I know about heart disease, diabetes, and high blood pressure? Blood pressure and heart disease High blood pressure causes heart disease and increases the risk of stroke. This is more likely to develop in people who have high blood pressure readings, are of African descent, or are overweight. Have your blood pressure checked: Every 3-5 years if you are 66-71 years of age. Every year if you are 47 years old or older. Diabetes Have regular diabetes screenings. This checks your fasting blood sugar level. Have the screening done: Once every three years after age 40 if you are at a normal weight and have a low risk for diabetes. More often and at a younger age if you are overweight or have a high risk for diabetes. What should I know about preventing infection? Hepatitis B If you have a higher risk for hepatitis B, you should be screened for this virus. Talk with your health care provider to find out if you are at risk for hepatitis B infection. Hepatitis C Testing is recommended for: Everyone born from [birth year] through 1965. Anyone with known risk factors for hepatitis C. Sexually transmitted infections (STIs) Get screened for STIs, including gonorrhea and chlamydia, if: You are sexually active and are younger than 55 years of age. You are older than 55 years of age and your health care provider tells you that you are at risk for this type of infection. Your sexual activity has changed since you were last screened, and you are at increased risk for chlamydia or gonorrhea. Ask  your health care provider if you are at risk. Ask your health care provider about whether you are at high risk for HIV. Your health care provider may recommend a prescription medicine to help prevent HIV infection. If you choose to take medicine to prevent HIV, you should first get tested for HIV. You should then be tested every 3 months for as  long as you are taking the medicine. Pregnancy If you are about to stop having your period (premenopausal) and you may become pregnant, seek counseling before you get pregnant. Take 400 to 800 micrograms (mcg) of folic acid every day if you become pregnant. Ask for birth control (contraception) if you want to prevent pregnancy. Osteoporosis and menopause Osteoporosis is a disease in which the bones lose minerals and strength with aging. This can result in bone fractures. If you are 60 years old or older, or if you are at risk for osteoporosis and fractures, ask your health care provider if you should: Be screened for bone loss. Take a calcium or vitamin D supplement to lower your risk of fractures. Be given hormone replacement therapy (HRT) to treat symptoms of menopause. Follow these instructions at home: Lifestyle Do not use any products that contain nicotine or tobacco, such as cigarettes, e-cigarettes, and chewing tobacco. If you need help quitting, ask your health care provider. Do not use street drugs. Do not share needles. Ask your health care provider for help if you need support or information about quitting drugs. Alcohol use Do not drink alcohol if: Your health care provider tells you not to drink. You are pregnant, may be pregnant, or are planning to become pregnant. If you drink alcohol: Limit how much you use to 0-1 drink a day. Limit intake if you are breastfeeding. Be aware of how much alcohol is in your drink. In the [place] one drink equals one 12 oz bottle of beer (355 mL), one 5 oz glass of wine (148 mL), or one 1 oz glass of hard liquor (44 mL). General instructions Schedule regular health, dental, and eye exams. Stay current with your vaccines. Tell your health care provider if: You often feel depressed. You have ever been abused or do not feel safe at home. Summary Adopting a healthy lifestyle and getting preventive care are important in promoting health and  wellness. Follow your health care provider's instructions about healthy diet, exercising, and getting tested or screened for diseases. Follow your health care provider's instructions on monitoring your cholesterol and blood pressure. This information is not intended to replace advice given to you by your health care provider. Make sure you discuss any questions you have with your health care provider. Document Revised: 12/14/2020 Document Reviewed: 09/29/2018 Elsevier Patient Education  2022 [employer].

## 2021-07-05 NOTE — Progress Notes (Signed)
Patient ID: Anne Moreno, female    DOB: [birth year], 55 y.o.   MRN: [id number]  This visit was conducted in person.  BP 130/82   Pulse 75   Temp 97.7 F (36.5 C) (Temporal)   Ht '5\' 3"'$  (1.6 m)   Wt 144 lb (65.3 kg)   SpO2 99%   BMI 25.51 kg/m    CC: CPE Subjective:   HPI: Anne Moreno is a 55 y.o. female presenting on 07/05/2021 for Annual Exam   RLS, worse at night - overall improved. Gabapentin previously ineffective, requip didn't really help.  Bug bite to L upper arm sustained 04/22/2021 - persistent nodule, treated with triamcinolone   Preventative: COLONOSCOPY WITH PROPOFOL 06/28/2018 - TA x3, rpt 3 yrs Vicente Males, Bailey Mech, MD) - planning to reschedule soon.  Well woman - OBGYN ([facility]), last pelvic exam 2017. S/p hysterectomy 2012 for heavy bleeding, ovaries remain. Ongoing menopausal symptoms - hot flashes and sleep disturbances. Never on HRT. Has tried OTC remedies.  Mammogram 07/2020 @ [facility] - [facility]: benign R breast calcifications Lung cancer screening - not eligible  Flu shot - doesn't receive.  Td 1996, Tdap 2018  COVID vaccine - declined.  Shingrix - discussed.  Seat belt use discussed  Sunscreen use discussed. No changing moles on skin  Sleep - averaging 6-7 hrs/night  Ex smoker. Quit full time smoking age 44yo (social smoking). <10 PY hx.  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 Interim medical history since our last visit reviewed. Allergies and medications reviewed and updated. Outpatient Medications Prior to Visit  Medication Sig  Dispense Refill   fexofenadine (ALLEGRA) 180 MG tablet TAKE 1 TABLET BY MOUTH EVERY DAY 30 tablet 11   Multiple Vitamin (MULTIVITAMIN PO) Take by mouth daily.     rOPINIRole (REQUIP) 0.25 MG tablet TAKE 1 TABLET (0.25 MG TOTAL) BY MOUTH AT BEDTIME. FOR RESTLESS LEG SYMPTOMS 90 tablet 1   No facility-administered medications prior to visit.     Per HPI unless specifically indicated in ROS section below Review of Systems  Constitutional:  Negative for activity change, appetite change, chills, fatigue, fever and unexpected weight change.  HENT:  Negative for hearing loss.   Eyes:  Negative for visual disturbance.  Respiratory:  Negative for cough, chest tightness, shortness of breath and wheezing.   Cardiovascular:  Negative for chest pain, palpitations and leg swelling.  Gastrointestinal:  Negative for abdominal distention, abdominal pain, blood in stool, constipation, diarrhea, nausea and vomiting.  Genitourinary:  Negative for difficulty urinating and hematuria.  Musculoskeletal:  Negative for arthralgias, myalgias and neck pain.  Skin:  Negative for rash.  Neurological:  Negative for dizziness, seizures, syncope and headaches.  Hematological:  Negative for adenopathy. Does not bruise/bleed easily.  Psychiatric/Behavioral:  Negative for dysphoric mood. The patient is not nervous/anxious.    Objective:  BP 130/82   Pulse 75   Temp 97.7 F (36.5 C) (Temporal)   Ht '5\' 3"'$  (1.6 m)   Wt 144 lb (65.3 kg)   SpO2 99%   BMI 25.51 kg/m  Wt Readings from Last 3 Encounters:  07/05/21 144 lb (65.3 kg)  07/04/20 149 lb 8 oz (67.8 kg)  05/30/19 151 lb (68.5 kg)      Physical Exam Vitals and nursing note reviewed.  Constitutional:      Appearance: Normal appearance. She is not ill-appearing.  HENT:     Head: Normocephalic and atraumatic.     Right Ear: Tympanic membrane, ear canal and external ear normal. There is no impacted cerumen.     Left Ear: Tympanic membrane, ear canal and  external ear normal. There is no impacted cerumen.  Eyes:     General:        Right eye: No discharge.        Left eye: No discharge.     Extraocular Movements: Extraocular movements intact.     Conjunctiva/sclera: Conjunctivae normal.     Pupils: Pupils are equal, round, and reactive to light.  Neck:     Thyroid: No thyroid mass or thyromegaly.  Cardiovascular:     Rate and Rhythm: Normal rate and regular rhythm.     Pulses: Normal pulses.     Heart sounds: Normal heart sounds. No murmur heard. Pulmonary:     Effort: Pulmonary effort is normal. No respiratory distress.     Breath sounds: Normal breath sounds. No wheezing, rhonchi or rales.  Abdominal:     General: Bowel sounds are normal. There is no distension.     Palpations: Abdomen is soft. There is no mass.     Tenderness: There is no abdominal tenderness. There is no guarding or rebound.     Hernia: No hernia is present.  Musculoskeletal:     Cervical back: Normal range of motion and neck supple. No rigidity.     Right lower leg: No edema.     Left lower leg: No edema.  Lymphadenopathy:     Cervical: No cervical adenopathy.  Skin:    General: Skin is warm and dry.     Findings: Lesion present. No rash.     Comments: Small nodule to left lateral distal upper arm, ?residual cyst  Neurological:     General: No focal deficit present.     Mental Status: She is alert. Mental status is at baseline.  Psychiatric:        Mood and Affect: Mood normal.        Behavior: Behavior normal.      Results for orders placed or performed in visit on 06/27/20  Hepatic function panel  Result Value Ref Range   Total Bilirubin 1.0 0.2 - 1.2 mg/dL   Bilirubin, Direct 0.1 0.0 - 0.3 mg/dL   Alkaline Phosphatase 53 39 - 117 U/L   AST 14 0 - 37 U/L   ALT 8 0 - 35 U/L   Total Protein 7.1 6.0 - 8.3 g/dL   Albumin 4.6 3.5 - 5.2 g/dL  Hepatitis C antibody  Result Value Ref Range   Hepatitis C Ab NON-REACTIVE NON-REACTI   SIGNAL TO CUT-OFF  0.01 [id number]  Basic metabolic panel  Result Value Ref Range   Sodium 139 135 - 145 mEq/L   Potassium 4.5 3.5 - 5.1 mEq/L   Chloride 102 96 - 112 mEq/L   CO2 29 19 - 32 mEq/L   Glucose, Bld 88 70 - 99 mg/dL   BUN 16 6 - 23 mg/dL   Creatinine, Ser 0.88 0.40 - 1.20 mg/dL   GFR 66.83 >60.00 mL/min   Calcium 9.9 8.4 - 10.5  mg/dL  Lipid panel  Result Value Ref Range   Cholesterol 232 (H) 0 - 200 mg/dL   Triglycerides 146.0 0.0 - 149.0 mg/dL   HDL 60.00 >39.00 mg/dL   VLDL 29.2 0.0 - 40.0 mg/dL   LDL Cholesterol 143 (H) 0 - 99 mg/dL   Total CHOL/HDL Ratio 4    NonHDL 172.34    Assessment & Plan:  This visit occurred during the SARS-CoV-2 public health emergency.  Safety protocols were in place, including screening questions prior to the visit, additional usage of staff PPE, and extensive cleaning of exam room while observing appropriate contact time as indicated for disinfecting solutions.   Problem List Items Addressed This Visit     Health maintenance examination - Primary (Chronic)    Preventative protocols reviewed and updated unless pt declined. Discussed healthy diet and lifestyle.       Pure hypercholesterolemia    Chronic, off meds.  The 10-year ASCVD risk score (Arnett DK, et al., 2019) is: 2.2%   Values used to calculate the score:     Age: 39 years     Sex: Female     Is Non-Hispanic African American: No     Diabetic: No     Tobacco smoker: No     Systolic Blood Pressure: [id number] mmHg     Is BP treated: No     HDL Cholesterol: 60 mg/dL     Total Cholesterol: 232 mg/dL       Relevant Orders   Lipid panel   Comprehensive metabolic panel   TSH   RLS (restless legs syndrome)    No recent trouble.       Relevant Orders   CBC with Differential/Platelet   Skin nodule    Anticipate initial infected epidermal cyst that is now better, but small cyst/nodule persists.         No orders of the defined types were placed in this encounter.  Orders Placed This Encounter   Procedures   Lipid panel   Comprehensive metabolic panel   TSH   CBC with Differential/Platelet    Patient insructions: Check on GYN appointment.  Consider shingles vaccine.  Labs today  Urinalysis today  Good to see you today Return as needed or in 1 year for next physical.   Follow up plan: Return in about 1 year (around 07/05/2022), or if symptoms worsen or fail to improve.  Ria Bush, MD

## 2021-07-06 LAB — CBC WITH DIFFERENTIAL/PLATELET
Absolute Monocytes: 458 cells/uL (ref 200–950)
Basophils Absolute: 41 cells/uL (ref 0–200)
Basophils Relative: 0.7 %
Eosinophils Absolute: 93 cells/uL (ref 15–500)
Eosinophils Relative: 1.6 %
HCT: 46.7 % — ABNORMAL HIGH (ref 35.0–45.0)
Hemoglobin: 15.3 g/dL (ref 11.7–15.5)
Lymphs Abs: 1931 cells/uL (ref 850–3900)
MCH: 29.8 pg (ref 27.0–33.0)
MCHC: 32.8 g/dL (ref 32.0–36.0)
MCV: 90.9 fL (ref 80.0–100.0)
MPV: 11.6 fL (ref 7.5–12.5)
Monocytes Relative: 7.9 %
Neutro Abs: 3277 cells/uL (ref 1500–7800)
Neutrophils Relative %: 56.5 %
Platelets: 229 10*3/uL (ref 140–400)
RBC: 5.14 10*6/uL — ABNORMAL HIGH (ref 3.80–5.10)
RDW: 12.5 % (ref 11.0–15.0)
Total Lymphocyte: 33.3 %
WBC: 5.8 10*3/uL (ref 3.8–10.8)

## 2021-07-06 LAB — LIPID PANEL
Cholesterol: 265 mg/dL — ABNORMAL HIGH (ref <200)
HDL: 72 mg/dL (ref >=50)
LDL Cholesterol (Calc): 162 mg/dL (calc) — ABNORMAL HIGH
Non-HDL Cholesterol (Calc): 193 mg/dL (calc) — ABNORMAL HIGH (ref <130)
Total CHOL/HDL Ratio: 3.7 (calc) (ref <5.0)
Triglycerides: 166 mg/dL — ABNORMAL HIGH (ref <150)

## 2021-07-06 LAB — COMPREHENSIVE METABOLIC PANEL
AG Ratio: 1.9 (calc) (ref 1.0–2.5)
ALT: 9 U/L (ref 6–29)
AST: 17 U/L (ref 10–35)
Albumin: 5.1 g/dL (ref 3.6–5.1)
Alkaline phosphatase (APISO): 58 U/L (ref 37–153)
BUN: 10 mg/dL (ref 7–25)
CO2: 23 mmol/L (ref 20–32)
Calcium: 10.9 mg/dL — ABNORMAL HIGH (ref 8.6–10.4)
Chloride: 104 mmol/L (ref 98–110)
Creat: 0.84 mg/dL (ref 0.50–1.03)
Globulin: 2.7 g/dL (calc) (ref 1.9–3.7)
Glucose, Bld: 90 mg/dL (ref 65–99)
Potassium: 4.8 mmol/L (ref 3.5–5.3)
Sodium: 144 mmol/L (ref 135–146)
Total Bilirubin: 0.6 mg/dL (ref 0.2–1.2)
Total Protein: 7.8 g/dL (ref 6.1–8.1)

## 2021-07-06 LAB — TSH: TSH: 1.32 mIU/L

## 2021-07-08 ENCOUNTER — Encounter: Payer: Self-pay | Admitting: Family Medicine

## 2021-07-09 ENCOUNTER — Encounter: Payer: Self-pay | Admitting: Family Medicine

## 2021-07-17 IMAGING — MG DIGITAL DIAGNOSTIC UNILAT RIGHT W/ CAD
5 series · 6 of 9 positions shown · non-contrast
Comparison: Previous exam(s).

CLINICAL DATA: Callback for RIGHT breast calcifications.

EXAM:
DIGITAL DIAGNOSTIC RIGHT MAMMOGRAM WITH CAD

[R ML (1 of 2)]
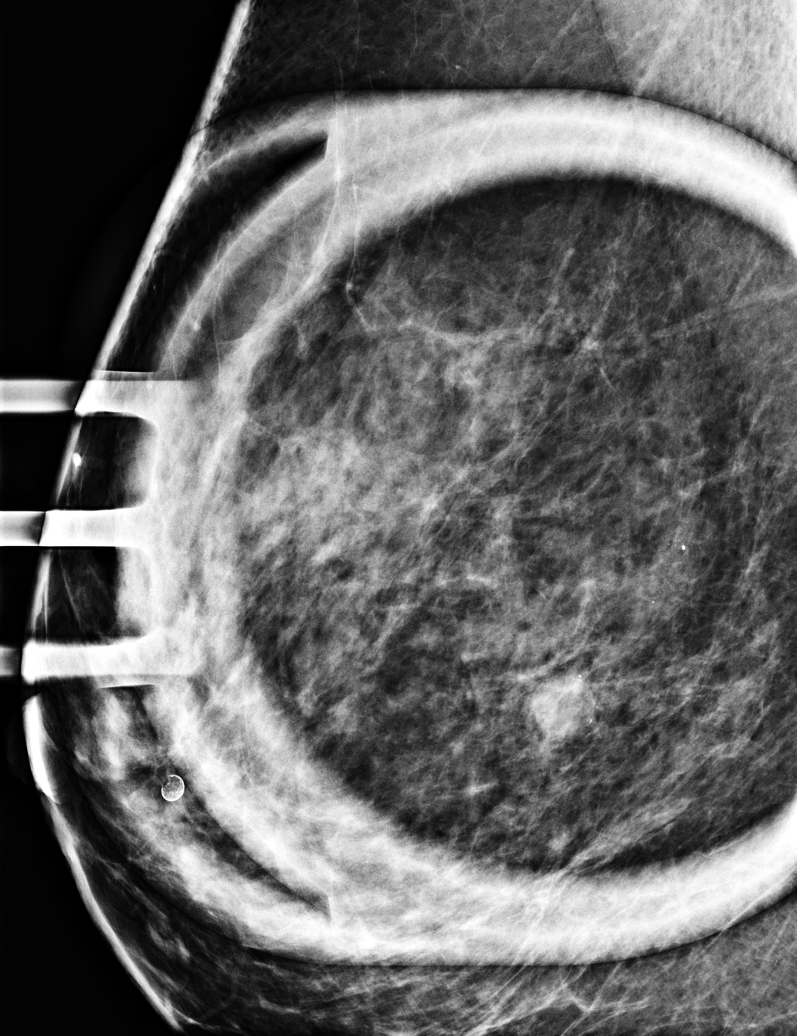

[R CC]
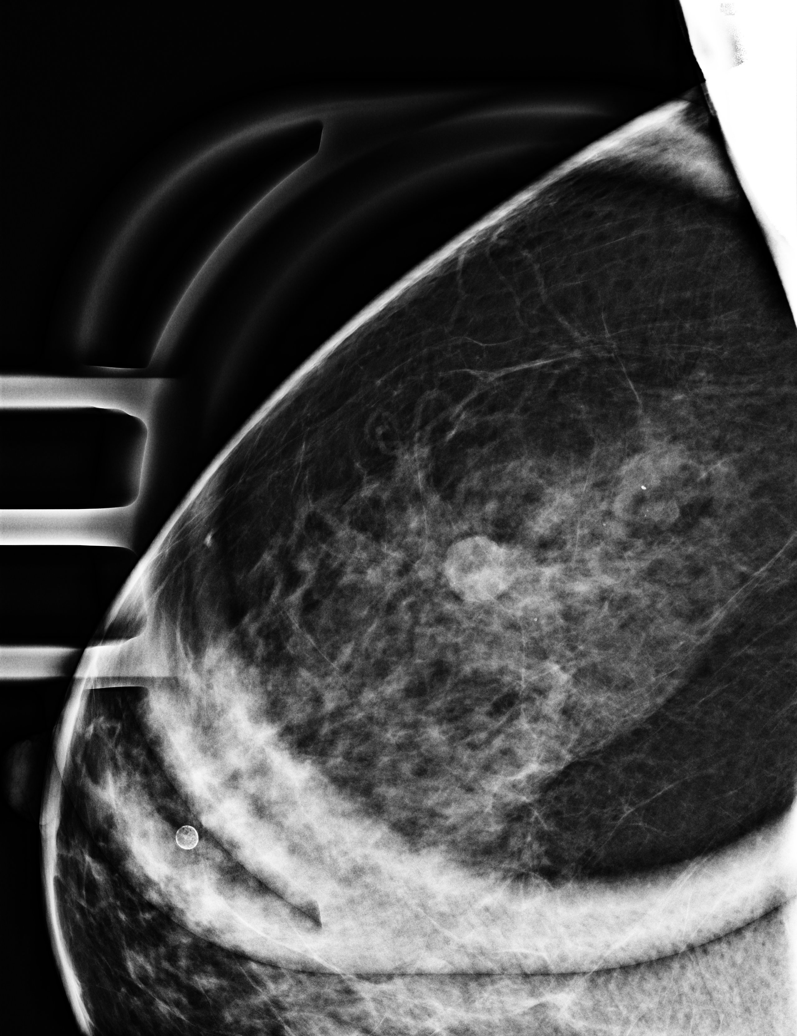

[R ML synth-2D]
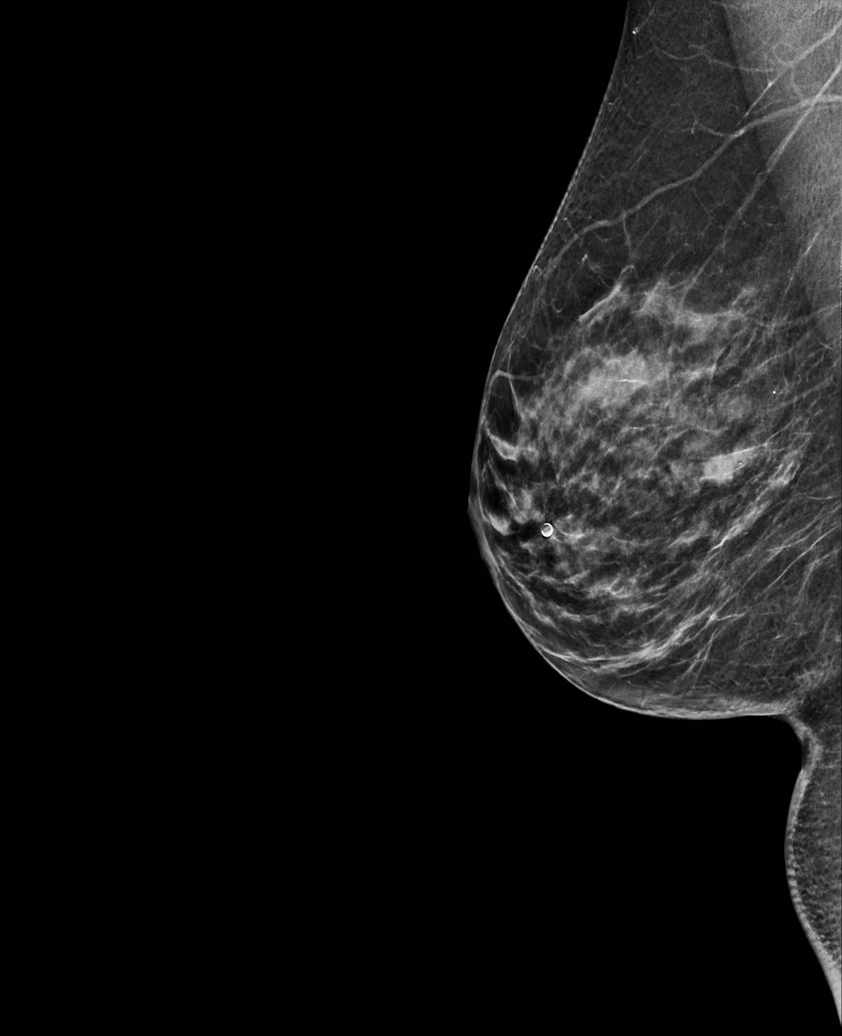

[R ML (2 of 2)]
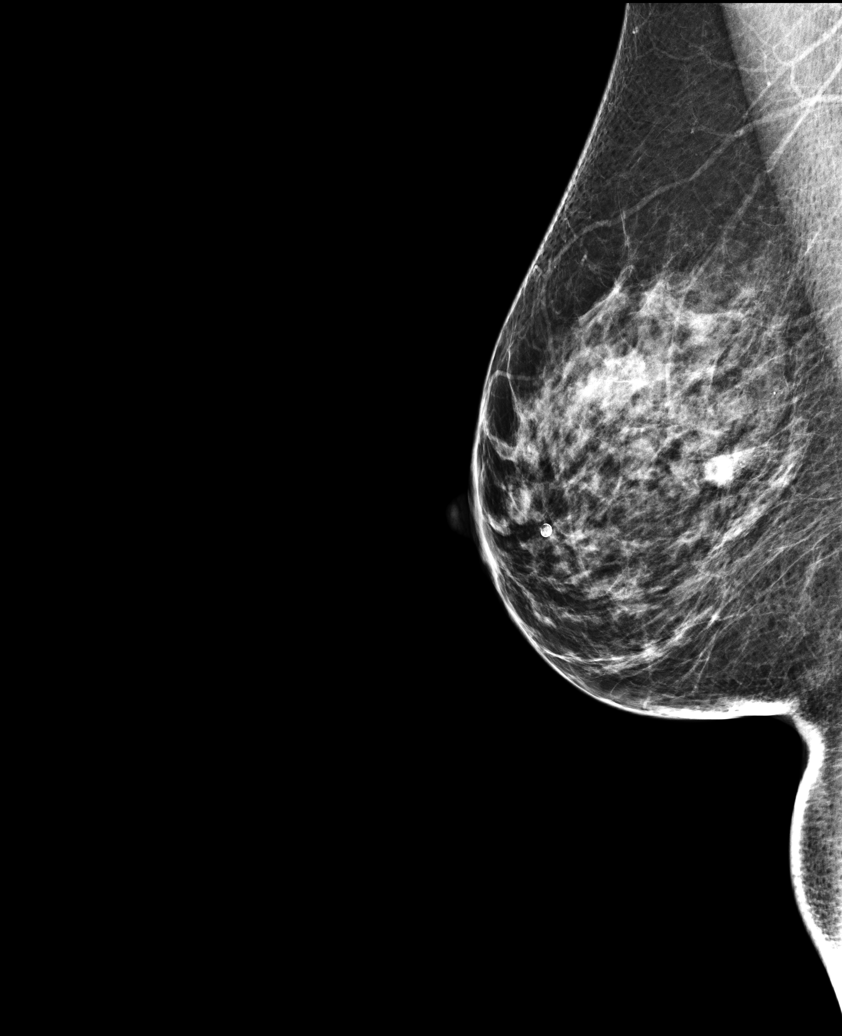

[R ML tomo · 2 of 57 frames shown]
[frame 19/57]
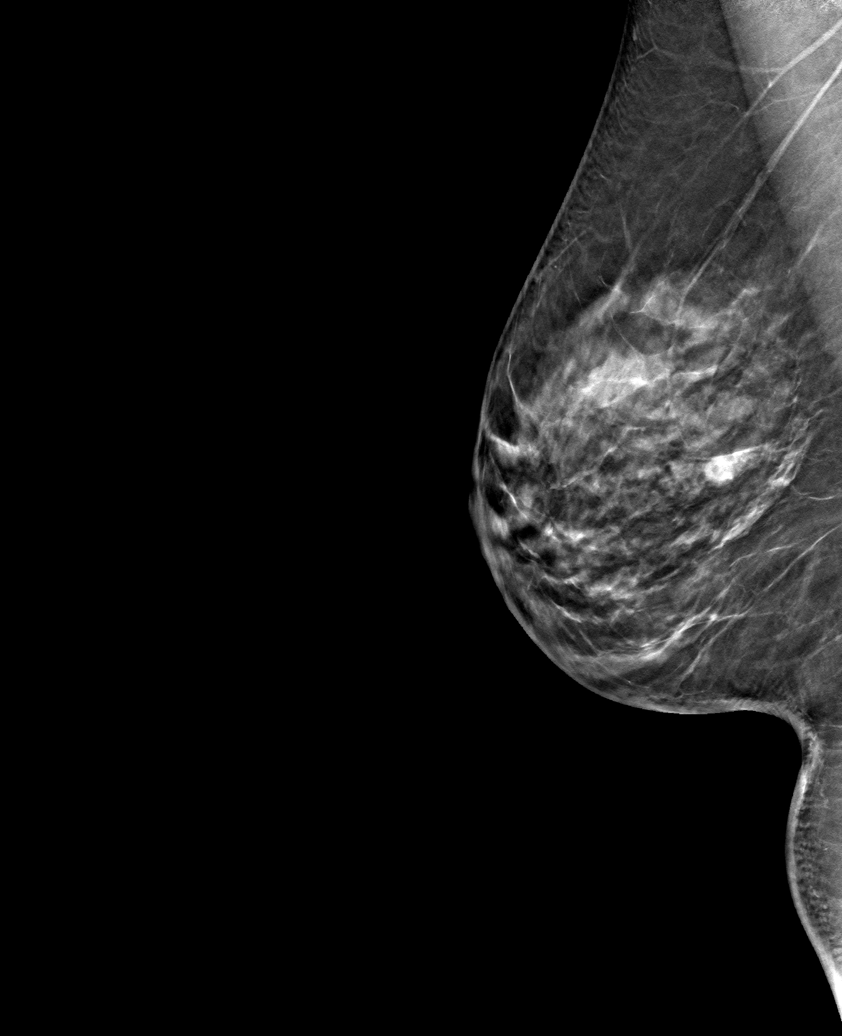
[frame 29/57]
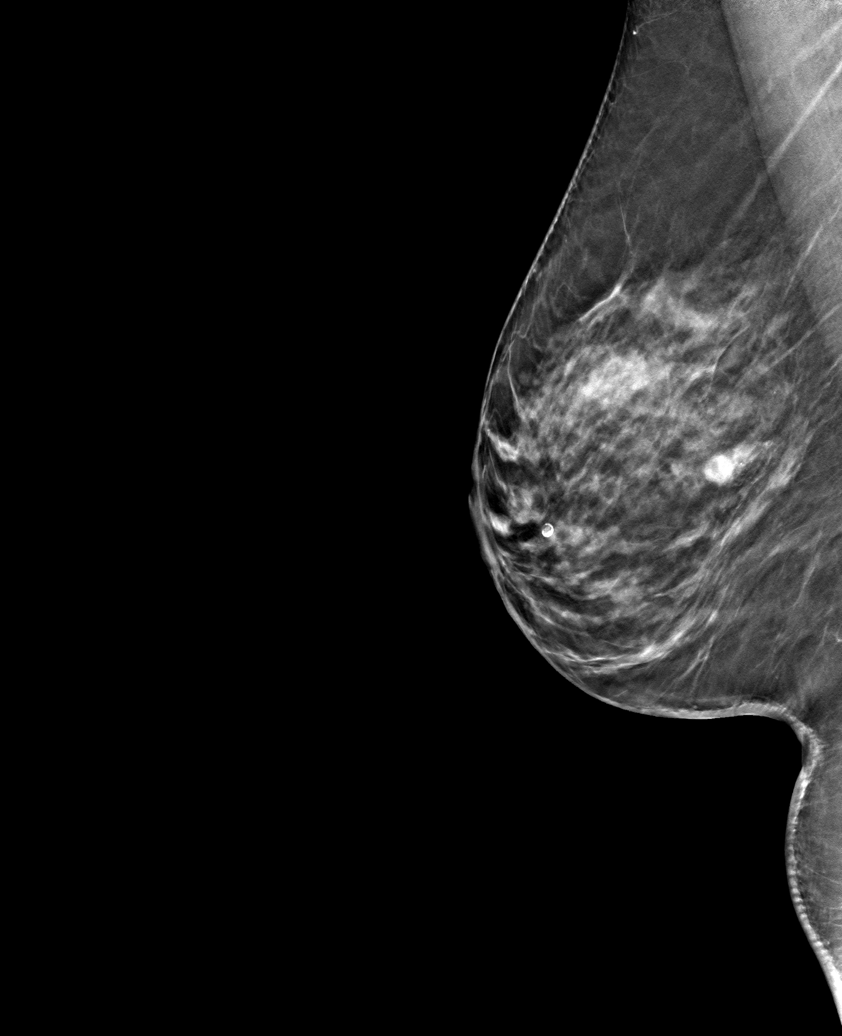

[6 of 9 positions shown; findings below may reference images not displayed]

ACR Breast Density Category c: The breast tissue is heterogeneously
dense, which may obscure small masses.
FINDINGS: Spot magnification views demonstrate scattered and occasionally
grouped calcifications in the RIGHT upper outer breast. Multiple of
these calcifications assume milk of calcium morphology. At the
posterior aspect of these calcifications, there is a loosely
clustered group of round calcifications which are mammographically
stable since [REDACTED] 2434 and are thereby benign. There is an oval
circumscribed mass in the outer breast, consistent with patient
history of cysts at this location.

Mammographic images were processed with CAD.
IMPRESSION: There are benign breast calcifications in the RIGHT upper outer
breast at the site of screening mammographic concern.

RECOMMENDATION:
Screening mammogram in one year.(Code:CT-4-ANI)

I have discussed the findings and recommendations with the patient.
If applicable, a reminder letter will be sent to the patient
regarding the next appointment.

BI-RADS CATEGORY  2: Benign.

## 2022-05-15 ENCOUNTER — Telehealth: Payer: Self-pay | Admitting: Physician Assistant

## 2022-05-15 ENCOUNTER — Encounter: Payer: Self-pay | Admitting: Physician Assistant

## 2022-05-15 DIAGNOSIS — J019 Acute sinusitis, unspecified: Secondary | ICD-10-CM

## 2022-05-15 DIAGNOSIS — B9689 Other specified bacterial agents as the cause of diseases classified elsewhere: Secondary | ICD-10-CM

## 2022-05-15 MED ORDER — DOXYCYCLINE HYCLATE 100 MG PO TABS: 100.0000 mg | ORAL_TABLET | Freq: Two times a day (BID) | ORAL | 0 refills | Status: DC

## 2022-05-15 MED ORDER — COVID-19 AT HOME ANTIGEN TEST VI KIT: PACK | 0 refills | Status: DC

## 2022-05-15 NOTE — Patient Instructions (Signed)
Anne Brigham Sui, thank you for joining Leeanne Rio, PA-C for today's virtual visit.  While this provider is not your primary care provider (PCP), if your PCP is located in our provider database this encounter information will be shared with them immediately following your visit.  Consent: (Patient) Anne Moreno provided verbal consent for this virtual visit at the beginning of the encounter.  Current Medications:  Current Outpatient Medications:    fexofenadine (ALLEGRA) 180 MG tablet, TAKE 1 TABLET BY MOUTH EVERY DAY, Disp: 30 tablet, Rfl: 11   Multiple Vitamin (MULTIVITAMIN PO), Take by mouth daily., Disp: , Rfl:    Medications ordered in this encounter:  No orders of the defined types were placed in this encounter.    *If you need refills on other medications prior to your next appointment, please contact your pharmacy*  Follow-Up: Call back or seek an in-person evaluation if the symptoms worsen or if the condition fails to improve as anticipated.  Other Instructions Please take the home COVID test and let me know if positive.  Otherwise, follow recommendations below.  Please take antibiotic as directed.  Increase fluid intake.  Use Saline nasal spray.  Take a daily multivitamin. You can use Tylenol Cold and Sinus OTC as well. Also consider OTC Flonase nasal spray.  Place a humidifier in the bedroom.  Please call or return clinic if symptoms are not improving.  Sinusitis Sinusitis is redness, soreness, and swelling (inflammation) of the paranasal sinuses. Paranasal sinuses are air pockets within the bones of your face (beneath the eyes, the middle of the forehead, or above the eyes). In healthy paranasal sinuses, mucus is able to drain out, and air is able to circulate through them by way of your nose. However, when your paranasal sinuses are inflamed, mucus and air can become trapped. This can allow bacteria and other germs to grow and cause  infection. Sinusitis can develop quickly and last only a short time (acute) or continue over a long period (chronic). Sinusitis that lasts for more than 12 weeks is considered chronic.  CAUSES  Causes of sinusitis include: Allergies. Structural abnormalities, such as displacement of the cartilage that separates your nostrils (deviated septum), which can decrease the air flow through your nose and sinuses and affect sinus drainage. Functional abnormalities, such as when the small hairs (cilia) that line your sinuses and help remove mucus do not work properly or are not present. SYMPTOMS  Symptoms of acute and chronic sinusitis are the same. The primary symptoms are pain and pressure around the affected sinuses. Other symptoms include: Upper toothache. Earache. Headache. Bad breath. Decreased sense of smell and taste. A cough, which worsens when you are lying flat. Fatigue. Fever. Thick drainage from your nose, which often is green and may contain pus (purulent). Swelling and warmth over the affected sinuses. DIAGNOSIS  Your caregiver will perform a physical exam. During the exam, your caregiver may: Look in your nose for signs of abnormal growths in your nostrils (nasal polyps). Tap over the affected sinus to check for signs of infection. View the inside of your sinuses (endoscopy) with a special imaging device with a light attached (endoscope), which is inserted into your sinuses. If your caregiver suspects that you have chronic sinusitis, one or more of the following tests may be recommended: Allergy tests. Nasal culture A sample of mucus is taken from your nose and sent to a lab and screened for bacteria. Nasal cytology A sample of mucus is taken from your  nose and examined by your caregiver to determine if your sinusitis is related to an allergy. TREATMENT  Most cases of acute sinusitis are related to a viral infection and will resolve on their own within 10 days. Sometimes medicines  are prescribed to help relieve symptoms (pain medicine, decongestants, nasal steroid sprays, or saline sprays).  However, for sinusitis related to a bacterial infection, your caregiver will prescribe antibiotic medicines. These are medicines that will help kill the bacteria causing the infection.  Rarely, sinusitis is caused by a fungal infection. In theses cases, your caregiver will prescribe antifungal medicine. For some cases of chronic sinusitis, surgery is needed. Generally, these are cases in which sinusitis recurs more than 3 times per year, despite other treatments. HOME CARE INSTRUCTIONS  Drink plenty of water. Water helps thin the mucus so your sinuses can drain more easily. Use a humidifier. Inhale steam 3 to 4 times a day (for example, sit in the bathroom with the shower running). Apply a warm, moist washcloth to your face 3 to 4 times a day, or as directed by your caregiver. Use saline nasal sprays to help moisten and clean your sinuses. Take over-the-counter or prescription medicines for pain, discomfort, or fever only as directed by your caregiver. SEEK IMMEDIATE MEDICAL CARE IF: You have increasing pain or severe headaches. You have nausea, vomiting, or drowsiness. You have swelling around your face. You have vision problems. You have a stiff neck. You have difficulty breathing. MAKE SURE YOU:  Understand these instructions. Will watch your condition. Will get help right away if you are not doing well or get worse. Document Released: 10/06/2005 Document Revised: 12/29/2011 Document Reviewed: 10/21/2011 [facility] Patient Information 2014 [place], [place].    If you have been instructed to have an in-person evaluation today at a local Urgent Care facility, please use the link below. It will take you to a list of all of our available [facility], including address, phone number and hours of operation. Please do not delay care.  [facility]  If you or  a family member do not have a primary care provider, use the link below to schedule a visit and establish care. When you choose a [facility] primary care physician or advanced practice provider, you gain a long-term partner in health. Find a Primary Care Provider  Learn more about [facility]'s in-office and virtual care options: [facility]

## 2022-05-15 NOTE — Progress Notes (Signed)
Virtual Visit Consent   Anne Moreno, you are scheduled for a virtual visit with a [facility] provider today. Just as with appointments in the office, your consent must be obtained to participate. Your consent will be active for this visit and any virtual visit you may have with one of our providers in the next 365 days. If you have a MyChart account, a copy of this consent can be sent to you electronically.  As this is a virtual visit, video technology does not allow for your provider to perform a traditional examination. This may limit your provider's ability to fully assess your condition. If your provider identifies any concerns that need to be evaluated in person or the need to arrange testing (such as labs, EKG, etc.), we will make arrangements to do so. Although advances in technology are sophisticated, we cannot ensure that it will always work on either your end or our end. If the connection with a video visit is poor, the visit may have to be switched to a telephone visit. With either a video or telephone visit, we are not always able to ensure that we have a secure connection.  By engaging in this virtual visit, you consent to the provision of healthcare and authorize for your insurance to be billed (if applicable) for the services provided during this visit. Depending on your insurance coverage, you may receive a charge related to this service.  I need to obtain your verbal consent now. Are you willing to proceed with your visit today? Anne Moreno has provided verbal consent on 05/15/2022 for a virtual visit (video or telephone). Leeanne Rio, [place]  Date: 05/15/2022 9:57 AM  Virtual Visit via Video Note   I, Leeanne Rio, connected with  Anne Moreno  ([id number], 04-20-1966) on 05/15/22 at  9:45 AM EDT by a video-enabled telemedicine application and verified that I am speaking with the correct person using two  identifiers.  Location: Patient: Virtual Visit Location Patient: Home Provider: Virtual Visit Location Provider: Home Office   I discussed the limitations of evaluation and management by telemedicine and the availability of in person appointments. The patient expressed understanding and agreed to proceed.    History of Present Illness: Anne Moreno is a 56 y.o. who identifies as a female who was assigned female at birth, and is being seen today for possible sinusitis. Has noted low-grade fever off and on since starting with sinus pressure, sinus pain, nasal congestion and headache. Thick yellow-brown nasal discharge. Denies recent travel or sick contact.  Denies chest pain, SOB.   HPI: HPI  Problems:  Patient Active Problem List   Diagnosis Date Noted   Hypercalcemia 07/08/2021   Skin nodule 07/05/2021   Allergic rhinitis 07/04/2020   RLS (restless legs syndrome) 04/19/2018   Hot flashes 04/19/2018   Health maintenance examination 05/12/2017   Pure hypercholesterolemia 05/12/2017    Allergies:  Allergies  Allergen Reactions   Molds & Smuts Other (See Comments)    Sneezing, watery eyes, nasal congestion   Pollen Extract Other (See Comments)    Sneezing, watery eyes, nasal congestion   Penicillins Nausea Only   Medications:  Current Outpatient Medications:    fexofenadine (ALLEGRA) 60 MG tablet, Take 60 mg by mouth 2 (two) times daily., Disp: , Rfl:    COVID-19 At Home Antigen Test KIT, Use as directed., Disp: 1 kit, Rfl: 0   doxycycline (VIBRA-TABS) 100 MG tablet, Take 1 tablet (100 mg total) by mouth  2 (two) times daily., Disp: 20 tablet, Rfl: 0   Multiple Vitamin (MULTIVITAMIN PO), Take by mouth daily., Disp: , Rfl:   Observations/Objective: Patient is well-developed, well-nourished in no acute distress.  Resting comfortably at home.  Head is normocephalic, atraumatic.  No labored breathing. Speech is clear and coherent with logical content.  Patient is alert  and oriented at baseline.   Assessment and Plan: 1. Acute bacterial sinusitis - doxycycline (VIBRA-TABS) 100 MG tablet; Take 1 tablet (100 mg total) by mouth 2 (two) times daily.  Dispense: 20 tablet; Refill: 0 - COVID-19 At Home Antigen Test KIT; Use as directed.  Dispense: 1 kit; Refill: 0  Giving severity of symptoms, recommend home COVID test to be cautious. If positive, she is to let us know. Otherwise will continue treatment for sinusitis. Rx Doxycycline.  Increase fluids.  Rest.  Saline nasal spray.  Probiotic.  Mucinex as directed.  Humidifier in bedroom. Other OTC medications reviewed.  Call or return to clinic if symptoms are not improving.   Follow Up Instructions: I discussed the assessment and treatment plan with the patient. The patient was provided an opportunity to ask questions and all were answered. The patient agreed with the plan and demonstrated an understanding of the instructions.  A copy of instructions were sent to the patient via MyChart unless otherwise noted below.   The patient was advised to call back or seek an in-person evaluation if the symptoms worsen or if the condition fails to improve as anticipated.  Time:  I spent 10 minutes with the patient via telehealth technology discussing the above problems/concerns.    Leeanne Rio, PA-C

## 2022-07-01 ENCOUNTER — Other Ambulatory Visit: Payer: 59

## 2022-07-02 ENCOUNTER — Other Ambulatory Visit: Payer: Self-pay

## 2022-07-02 DIAGNOSIS — Z86010 Personal history of colonic polyps: Secondary | ICD-10-CM

## 2022-07-02 MED ORDER — NA SULFATE-K SULFATE-MG SULF 17.5-3.13-1.6 GM/177ML PO SOLN: 354.0000 mL | Freq: Once | ORAL | 0 refills | Status: AC

## 2022-07-10 ENCOUNTER — Other Ambulatory Visit: Payer: Self-pay | Admitting: Family Medicine

## 2022-07-10 DIAGNOSIS — E7800 Pure hypercholesterolemia, unspecified: Secondary | ICD-10-CM

## 2022-07-10 DIAGNOSIS — R718 Other abnormality of red blood cells: Secondary | ICD-10-CM

## 2022-07-15 ENCOUNTER — Other Ambulatory Visit: Payer: 59

## 2022-07-16 ENCOUNTER — Encounter: Payer: 59 | Admitting: Family Medicine

## 2022-07-29 ENCOUNTER — Encounter: Payer: 59 | Admitting: Family Medicine

## 2022-07-31 ENCOUNTER — Other Ambulatory Visit (INDEPENDENT_AMBULATORY_CARE_PROVIDER_SITE_OTHER): Payer: Commercial Managed Care - PPO

## 2022-07-31 DIAGNOSIS — R718 Other abnormality of red blood cells: Secondary | ICD-10-CM | POA: Diagnosis not present

## 2022-07-31 DIAGNOSIS — E7800 Pure hypercholesterolemia, unspecified: Secondary | ICD-10-CM | POA: Diagnosis not present

## 2022-07-31 LAB — LIPID PANEL
Cholesterol: 229 mg/dL — ABNORMAL HIGH (ref 0–200)
HDL: 60.7 mg/dL (ref >39.00)
LDL Cholesterol: 134 mg/dL — ABNORMAL HIGH (ref 0–99)
NonHDL: 168.08
Total CHOL/HDL Ratio: 4
Triglycerides: 170 mg/dL — ABNORMAL HIGH (ref 0.0–149.0)
VLDL: 34 mg/dL (ref 0.0–40.0)

## 2022-07-31 LAB — CBC WITH DIFFERENTIAL/PLATELET
Basophils Absolute: 0 10*3/uL (ref 0.0–0.1)
Basophils Relative: 0.7 % (ref 0.0–3.0)
Eosinophils Absolute: 0.2 10*3/uL (ref 0.0–0.7)
Eosinophils Relative: 2.9 % (ref 0.0–5.0)
HCT: 41 % (ref 36.0–46.0)
Hemoglobin: 13.8 g/dL (ref 12.0–15.0)
Lymphocytes Relative: 30.7 % (ref 12.0–46.0)
Lymphs Abs: 1.7 10*3/uL (ref 0.7–4.0)
MCHC: 33.6 g/dL (ref 30.0–36.0)
MCV: 89.4 fl (ref 78.0–100.0)
Monocytes Absolute: 0.5 10*3/uL (ref 0.1–1.0)
Monocytes Relative: 9.4 % (ref 3.0–12.0)
Neutro Abs: 3.1 10*3/uL (ref 1.4–7.7)
Neutrophils Relative %: 56.3 % (ref 43.0–77.0)
Platelets: 196 10*3/uL (ref 150.0–400.0)
RBC: 4.59 Mil/uL (ref 3.87–5.11)
RDW: 13.2 % (ref 11.5–15.5)
WBC: 5.5 10*3/uL (ref 4.0–10.5)

## 2022-07-31 LAB — COMPREHENSIVE METABOLIC PANEL
ALT: 8 U/L (ref 0–35)
AST: 16 U/L (ref 0–37)
Albumin: 4.6 g/dL (ref 3.5–5.2)
Alkaline Phosphatase: 47 U/L (ref 39–117)
BUN: 12 mg/dL (ref 6–23)
CO2: 29 mEq/L (ref 19–32)
Calcium: 9.9 mg/dL (ref 8.4–10.5)
Chloride: 103 mEq/L (ref 96–112)
Creatinine, Ser: 0.92 mg/dL (ref 0.40–1.20)
GFR: 69.55 mL/min (ref >60.00)
Glucose, Bld: 85 mg/dL (ref 70–99)
Potassium: 4.8 mEq/L (ref 3.5–5.1)
Sodium: 142 mEq/L (ref 135–145)
Total Bilirubin: 0.8 mg/dL (ref 0.2–1.2)
Total Protein: 6.8 g/dL (ref 6.0–8.3)

## 2022-07-31 LAB — TSH: TSH: 2.7 u[IU]/mL (ref 0.35–5.50)

## 2022-07-31 LAB — VITAMIN D 25 HYDROXY (VIT D DEFICIENCY, FRACTURES): VITD: 54.46 ng/mL (ref 30.00–100.00)

## 2022-08-01 LAB — PARATHYROID HORMONE, INTACT (NO CA): PTH: 100 pg/mL — ABNORMAL HIGH (ref 16–77)

## 2022-08-04 ENCOUNTER — Encounter: Payer: Self-pay | Admitting: Family Medicine

## 2022-08-04 ENCOUNTER — Ambulatory Visit (INDEPENDENT_AMBULATORY_CARE_PROVIDER_SITE_OTHER): Payer: Commercial Managed Care - PPO | Admitting: Family Medicine

## 2022-08-04 VITALS — BP 124/84 | HR 71 | Temp 97.5°F | Ht 62.75 in | Wt 142.1 lb

## 2022-08-04 DIAGNOSIS — J3089 Other allergic rhinitis: Secondary | ICD-10-CM

## 2022-08-04 DIAGNOSIS — E213 Hyperparathyroidism, unspecified: Secondary | ICD-10-CM | POA: Diagnosis not present

## 2022-08-04 DIAGNOSIS — Z0000 Encounter for general adult medical examination without abnormal findings: Secondary | ICD-10-CM

## 2022-08-04 DIAGNOSIS — E7800 Pure hypercholesterolemia, unspecified: Secondary | ICD-10-CM | POA: Diagnosis not present

## 2022-08-04 NOTE — Assessment & Plan Note (Signed)
Continues daily allegra.

## 2022-08-04 NOTE — Assessment & Plan Note (Signed)
Initially elevated, now improved. See below.

## 2022-08-04 NOTE — Assessment & Plan Note (Signed)
Newly noted elevated PTH, checked due to abnormal calcium levels last year that subsequently normalized. Unsure clearance. Recommend endocrinology evaluation, she declines, prefers to recheck Ca, PTH in 3 months. Will send home with 24hr urine collection for calcium.  No h/o recurrent kidney stones, bone disease.

## 2022-08-04 NOTE — Progress Notes (Signed)
Patient ID: Anne Moreno, female    DOB: 1966/02/02, 56 y.o.   MRN: [id number]  This visit was conducted in person.  BP 124/84   Pulse 71   Temp (!) 97.5 F (36.4 C) (Temporal)   Ht 5' 2.75" (1.594 m)   Wt 142 lb 2 oz (64.5 kg)   SpO2 99%   BMI 25.38 kg/m    CC: CPE Subjective:   HPI: Anne Moreno is a 56 y.o. female presenting on 08/04/2022 for Annual Exam   RLS, worse at night - overall improved. Gabapentin previously ineffective, requip didn't really help.   Notes new tremor to bilateral over the past year  H/o remote kidney stone x1.  No fmhx parathyroid disease.    Preventative: COLONOSCOPY WITH PROPOFOL 06/28/2018 - TA x3, rpt 3 yrs Vicente Males, Bailey Mech, MD) - upcoming colonoscopy next week.  Well woman - OBGYN ([facility]), last pelvic exam 2017. S/p hysterectomy 2012 for heavy bleeding, ovaries remain. Ongoing menopausal symptoms - hot flashes and sleep disturbances - slowly improving. Never on HRT.  Mammogram 07/2020 @ [facility] - [facility]: benign R breast calcifications. Due for rpt.  Lung cancer screening - not eligible  Flu shot - doesn't receive.  Td 1996, Tdap 2018  COVID vaccine - declined.  Shingrix - discussed.  Seat belt use discussed  Sunscreen use discussed. No changing moles on skin.  Sleep - averaging 6-7 hrs/night  Ex smoker. Quit full time smoking age 51yo (social smoking). <10 PY hx.  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 Interim medical history since our last visit reviewed. Allergies and medications reviewed and updated. Outpatient Medications Prior to Visit   Medication Sig Dispense Refill   fexofenadine (ALLEGRA) 60 MG tablet Take 60 mg by mouth 2 (two) times daily.     Multiple Vitamin (MULTIVITAMIN PO) Take by mouth daily.     COVID-19 At Home Antigen Test KIT Use as directed. 1 kit 0   doxycycline (VIBRA-TABS) 100 MG tablet Take 1 tablet (100 mg total) by mouth 2 (two) times daily. 20 tablet 0   No facility-administered medications prior to visit.     Per HPI unless specifically indicated in ROS section below Review of Systems  Constitutional:  Negative for activity change, appetite change, chills, fatigue, fever and unexpected weight change.  HENT:  Negative for hearing loss.   Eyes:  Negative for visual disturbance.  Respiratory:  Negative for cough, chest tightness, shortness of breath and wheezing.   Cardiovascular:  Negative for chest pain, palpitations and leg swelling.  Gastrointestinal:  Negative for abdominal distention, abdominal pain, blood in stool, constipation, diarrhea, nausea and vomiting.  Genitourinary:  Negative for difficulty urinating and hematuria.  Musculoskeletal:  Negative for arthralgias, myalgias and neck pain.  Skin:  Negative for rash.  Neurological:  Negative for dizziness, seizures, syncope and headaches.  Hematological:  Negative for adenopathy. Does not bruise/bleed easily.  Psychiatric/Behavioral:  Negative for dysphoric mood. The patient is not nervous/anxious.     Objective:  BP 124/84   Pulse 71   Temp (!) 97.5 F (36.4 C) (Temporal)  Ht 5' 2.75" (1.594 m)   Wt 142 lb 2 oz (64.5 kg)   SpO2 99%   BMI 25.38 kg/m   Wt Readings from Last 3 Encounters:  08/04/22 142 lb 2 oz (64.5 kg)  07/05/21 144 lb (65.3 kg)  07/04/20 149 lb 8 oz (67.8 kg)      Physical Exam Vitals and nursing note reviewed.  Constitutional:      Appearance: Normal appearance. She is not ill-appearing.  HENT:     Head: Normocephalic and atraumatic.     Right Ear: Tympanic membrane, ear canal and external ear normal.  There is no impacted cerumen.     Left Ear: Tympanic membrane, ear canal and external ear normal. There is no impacted cerumen.  Eyes:     General:        Right eye: No discharge.        Left eye: No discharge.     Extraocular Movements: Extraocular movements intact.     Conjunctiva/sclera: Conjunctivae normal.     Pupils: Pupils are equal, round, and reactive to light.  Neck:     Thyroid: No thyroid mass or thyromegaly.  Cardiovascular:     Rate and Rhythm: Normal rate and regular rhythm.     Pulses: Normal pulses.     Heart sounds: Normal heart sounds. No murmur heard. Pulmonary:     Effort: Pulmonary effort is normal. No respiratory distress.     Breath sounds: Normal breath sounds. No wheezing, rhonchi or rales.  Abdominal:     General: Bowel sounds are normal. There is no distension.     Palpations: Abdomen is soft. There is no mass.     Tenderness: There is no abdominal tenderness. There is no guarding or rebound.     Hernia: No hernia is present.  Musculoskeletal:     Cervical back: Normal range of motion and neck supple. No rigidity.     Right lower leg: No edema.     Left lower leg: No edema.  Lymphadenopathy:     Cervical: No cervical adenopathy.  Skin:    General: Skin is warm and dry.     Findings: No rash.  Neurological:     General: No focal deficit present.     Mental Status: She is alert. Mental status is at baseline.  Psychiatric:        Mood and Affect: Mood normal.        Behavior: Behavior normal.       Results for orders placed or performed in visit on 07/31/22  CBC with Differential/Platelet  Result Value Ref Range   WBC 5.5 4.0 - 10.5 K/uL   RBC 4.59 3.87 - 5.11 Mil/uL   Hemoglobin 13.8 12.0 - 15.0 g/dL   HCT 41.0 36.0 - 46.0 %   MCV 89.4 78.0 - 100.0 fl   MCHC 33.6 30.0 - 36.0 g/dL   RDW 13.2 11.5 - 15.5 %   Platelets 196.0 150.0 - 400.0 K/uL   Neutrophils Relative % 56.3 43.0 - 77.0 %   Lymphocytes Relative 30.7 12.0 - 46.0 %   Monocytes  Relative 9.4 3.0 - 12.0 %   Eosinophils Relative 2.9 0.0 - 5.0 %   Basophils Relative 0.7 0.0 - 3.0 %   Neutro Abs 3.1 1.4 - 7.7 K/uL   Lymphs Abs 1.7 0.7 - 4.0 K/uL   Monocytes Absolute 0.5 0.1 - 1.0 K/uL   Eosinophils Absolute 0.2 0.0 - 0.7 K/uL   Basophils Absolute 0.0 0.0 -  0.1 K/uL  Parathyroid hormone, intact (no Ca)  Result Value Ref Range   PTH 100 (H) 16 - 77 pg/mL  TSH  Result Value Ref Range   TSH 2.70 0.35 - 5.50 uIU/mL  VITAMIN D 25 Hydroxy (Vit-D Deficiency, Fractures)  Result Value Ref Range   VITD 54.46 30.00 - 100.00 ng/mL  Comprehensive metabolic panel  Result Value Ref Range   Sodium 142 135 - 145 mEq/L   Potassium 4.8 3.5 - 5.1 mEq/L   Chloride 103 96 - 112 mEq/L   CO2 29 19 - 32 mEq/L   Glucose, Bld 85 70 - 99 mg/dL   BUN 12 6 - 23 mg/dL   Creatinine, Ser 0.92 0.40 - 1.20 mg/dL   Total Bilirubin 0.8 0.2 - 1.2 mg/dL   Alkaline Phosphatase 47 39 - 117 U/L   AST 16 0 - 37 U/L   ALT 8 0 - 35 U/L   Total Protein 6.8 6.0 - 8.3 g/dL   Albumin 4.6 3.5 - 5.2 g/dL   GFR 69.55 >60.00 mL/min   Calcium 9.9 8.4 - 10.5 mg/dL  Lipid panel  Result Value Ref Range   Cholesterol 229 (H) 0 - 200 mg/dL   Triglycerides 170.0 (H) 0.0 - 149.0 mg/dL   HDL 60.70 >39.00 mg/dL   VLDL 34.0 0.0 - 40.0 mg/dL   LDL Cholesterol 134 (H) 0 - 99 mg/dL   Total CHOL/HDL Ratio 4    NonHDL 168.08     Assessment & Plan:   Problem List Items Addressed This Visit     Health maintenance examination - Primary (Chronic)    Preventative protocols reviewed and updated unless pt declined. Discussed healthy diet and lifestyle.       Pure hypercholesterolemia    Chronic, some deteriorated. Reviewed diet choices to improve LDL levels. The 10-year ASCVD risk score (Arnett DK, et al., 2019) is: 2.2%   Values used to calculate the score:     Age: 49 years     Sex: Female     Is Non-Hispanic African American: No     Diabetic: No     Tobacco smoker: No     Systolic Blood Pressure: 837  mmHg     Is BP treated: No     HDL Cholesterol: 60.7 mg/dL     Total Cholesterol: 229 mg/dL       Allergic rhinitis    Continues daily allegra.       Hypercalcemia    Initially elevated, now improved. See below.       Hyperparathyroidism ([facility])    Newly noted elevated PTH, checked due to abnormal calcium levels last year that subsequently normalized. Unsure clearance. Recommend endocrinology evaluation, she declines, prefers to recheck Ca, PTH in 3 months. Will send home with 24hr urine collection for calcium.  No h/o recurrent kidney stones, bone disease.      Relevant Orders   Calcium, Urine, 24 Hour   PTH, Intact and Calcium   Phosphorus   Magnesium     No orders of the defined types were placed in this encounter.  Orders Placed This Encounter  Procedures   Calcium, Urine, 24 Hour    Standing Status:   Future    Standing Expiration Date:   08/05/2023   PTH, Intact and Calcium    Standing Status:   Future    Standing Expiration Date:   08/05/2023   Phosphorus    Standing Status:   Future    Standing Expiration Date:  08/05/2023   Magnesium    Standing Status:   Future    Standing Expiration Date:   08/05/2023     Patient instructions: Call to schedule mammogram at your convenience: [facility] [phone].  Consider shingles shots.  Pass by lab to pick up urine test for 24 hour collection  Schedule lab visit in 3-4 months to repeat calcium/parathyroid hormone levels.  Return in 1 year for next physical.   Follow up plan: Return in about 1 year (around 08/05/2023) for annual exam, prior fasting for blood work.  Ria Bush, MD

## 2022-08-04 NOTE — Assessment & Plan Note (Signed)
Preventative protocols reviewed and updated unless pt declined. Discussed healthy diet and lifestyle.  

## 2022-08-04 NOTE — Patient Instructions (Addendum)
Call to schedule mammogram at your convenience: [facility] at [facility] [phone].  Consider shingles shots.  Pass by lab to pick up urine test for 24 hour collection  Schedule lab visit in 3-4 months to repeat calcium/parathyroid hormone levels.  Return in 1 year for next physical.   Health Maintenance for Postmenopausal Women Menopause is a normal process in which your ability to get pregnant comes to an end. This process happens slowly over many months or years, usually between the ages of 27 and 63. Menopause is complete when you have missed your menstrual period for 12 months. It is important to talk with your health care provider about some of the most common conditions that affect women after menopause (postmenopausal women). These include heart disease, cancer, and bone loss (osteoporosis). Adopting a healthy lifestyle and getting preventive care can help to promote your health and wellness. The actions you take can also lower your chances of developing some of these common conditions. What are the signs and symptoms of menopause? During menopause, you may have the following symptoms: Hot flashes. These can be moderate or severe. Night sweats. Decrease in sex drive. Mood swings. Headaches. Tiredness (fatigue). Irritability. Memory problems. Problems falling asleep or staying asleep. Talk with your health care provider about treatment options for your symptoms. Do I need hormone replacement therapy? Hormone replacement therapy is effective in treating symptoms that are caused by menopause, such as hot flashes and night sweats. Hormone replacement carries certain risks, especially as you become older. If you are thinking about using estrogen or estrogen with progestin, discuss the benefits and risks with your health care provider. How can I reduce my risk for heart disease and stroke? The risk of heart disease, heart attack, and stroke increases as you age. One of the  causes may be a change in the body's hormones during menopause. This can affect how your body uses dietary fats, triglycerides, and cholesterol. Heart attack and stroke are medical emergencies. There are many things that you can do to help prevent heart disease and stroke. Watch your blood pressure High blood pressure causes heart disease and increases the risk of stroke. This is more likely to develop in people who have high blood pressure readings or are overweight. Have your blood pressure checked: Every 3-5 years if you are 33-60 years of age. Every year if you are 35 years old or older. Eat a healthy diet  Eat a diet that includes plenty of vegetables, fruits, low-fat dairy products, and lean protein. Do not eat a lot of foods that are high in solid fats, added sugars, or sodium. Get regular exercise Get regular exercise. This is one of the most important things you can do for your health. Most adults should: Try to exercise for at least 150 minutes each week. The exercise should increase your heart rate and make you sweat (moderate-intensity exercise). Try to do strengthening exercises at least twice each week. Do these in addition to the moderate-intensity exercise. Spend less time sitting. Even light physical activity can be beneficial. Other tips Work with your health care provider to achieve or maintain a healthy weight. Do not use any products that contain nicotine or tobacco. These products include cigarettes, chewing tobacco, and vaping devices, such as e-cigarettes. If you need help quitting, ask your health care provider. Know your numbers. Ask your health care provider to check your cholesterol and your blood sugar (glucose). Continue to have your blood tested as directed by your health  care provider. Do I need screening for cancer? Depending on your health history and family history, you may need to have cancer screenings at different stages of your life. This may include  screening for: Breast cancer. Cervical cancer. Lung cancer. Colorectal cancer. What is my risk for osteoporosis? After menopause, you may be at increased risk for osteoporosis. Osteoporosis is a condition in which bone destruction happens more quickly than new bone creation. To help prevent osteoporosis or the bone fractures that can happen because of osteoporosis, you may take the following actions: If you are 14-24 years old, get at least 1,000 mg of calcium and at least 600 international units (IU) of vitamin D per day. If you are older than age 38 but younger than age 33, get at least 1,200 mg of calcium and at least 600 international units (IU) of vitamin D per day. If you are older than age 44, get at least 1,200 mg of calcium and at least 800 international units (IU) of vitamin D per day. Smoking and drinking excessive alcohol increase the risk of osteoporosis. Eat foods that are rich in calcium and vitamin D, and do weight-bearing exercises several times each week as directed by your health care provider. How does menopause affect my mental health? Depression may occur at any age, but it is more common as you become older. Common symptoms of depression include: Feeling depressed. Changes in sleep patterns. Changes in appetite or eating patterns. Feeling an overall lack of motivation or enjoyment of activities that you previously enjoyed. Frequent crying spells. Talk with your health care provider if you think that you are experiencing any of these symptoms. General instructions See your health care provider for regular wellness exams and vaccines. This may include: Scheduling regular health, dental, and eye exams. Getting and maintaining your vaccines. These include: Influenza vaccine. Get this vaccine each year before the flu season begins. Pneumonia vaccine. Shingles vaccine. Tetanus, diphtheria, and pertussis (Tdap) booster vaccine. Your health care provider may also recommend  other immunizations. Tell your health care provider if you have ever been abused or do not feel safe at home. Summary Menopause is a normal process in which your ability to get pregnant comes to an end. This condition causes hot flashes, night sweats, decreased interest in sex, mood swings, headaches, or lack of sleep. Treatment for this condition may include hormone replacement therapy. Take actions to keep yourself healthy, including exercising regularly, eating a healthy diet, watching your weight, and checking your blood pressure and blood sugar levels. Get screened for cancer and depression. Make sure that you are up to date with all your vaccines. This information is not intended to replace advice given to you by your health care provider. Make sure you discuss any questions you have with your health care provider. Document Revised: 02/25/2021 Document Reviewed: 02/25/2021 Elsevier Patient Education  [place].

## 2022-08-04 NOTE — Assessment & Plan Note (Signed)
Chronic, some deteriorated. Reviewed diet choices to improve LDL levels. The 10-year ASCVD risk score (Arnett DK, et al., 2019) is: 2.2%   Values used to calculate the score:     Age: 56 years     Sex: Female     Is Non-Hispanic African American: No     Diabetic: No     Tobacco smoker: No     Systolic Blood Pressure: 017 mmHg     Is BP treated: No     HDL Cholesterol: 60.7 mg/dL     Total Cholesterol: 229 mg/dL

## 2022-08-15 ENCOUNTER — Encounter: Payer: Self-pay | Admitting: Gastroenterology

## 2022-08-15 ENCOUNTER — Ambulatory Visit: Payer: Commercial Managed Care - PPO | Admitting: Anesthesiology

## 2022-08-15 ENCOUNTER — Encounter
Admission: RE | Disposition: A | Discharge status: Home / Self Care | Payer: Self-pay | Source: Ambulatory Visit | Attending: Gastroenterology

## 2022-08-15 ENCOUNTER — Other Ambulatory Visit: Payer: Self-pay

## 2022-08-15 ENCOUNTER — Ambulatory Visit
Admission: RE | Admit: 2022-08-15 | Discharge: 2022-08-15 | Disposition: A | Discharge status: Home / Self Care | Payer: Commercial Managed Care - PPO | Source: Ambulatory Visit | Attending: Gastroenterology | Admitting: Gastroenterology

## 2022-08-15 DIAGNOSIS — Z860100 Personal history of colon polyps, unspecified: Secondary | ICD-10-CM

## 2022-08-15 DIAGNOSIS — Z1211 Encounter for screening for malignant neoplasm of colon: Secondary | ICD-10-CM | POA: Diagnosis present

## 2022-08-15 DIAGNOSIS — E7800 Pure hypercholesterolemia, unspecified: Secondary | ICD-10-CM | POA: Insufficient documentation

## 2022-08-15 DIAGNOSIS — D126 Benign neoplasm of colon, unspecified: Secondary | ICD-10-CM

## 2022-08-15 DIAGNOSIS — D125 Benign neoplasm of sigmoid colon: Secondary | ICD-10-CM | POA: Diagnosis not present

## 2022-08-15 DIAGNOSIS — D124 Benign neoplasm of descending colon: Secondary | ICD-10-CM | POA: Insufficient documentation

## 2022-08-15 DIAGNOSIS — Z87891 Personal history of nicotine dependence: Secondary | ICD-10-CM | POA: Insufficient documentation

## 2022-08-15 HISTORY — PX: COLONOSCOPY WITH PROPOFOL: SHX5780

## 2022-08-15 SURGERY — COLONOSCOPY WITH PROPOFOL
Anesthesia: General

## 2022-08-15 MED ORDER — PROPOFOL 10 MG/ML IV BOLUS
INTRAVENOUS | Status: DC | PRN
  Administered 2022-08-15: 10 mg via INTRAVENOUS
  Administered 2022-08-15: 70 mg via INTRAVENOUS

## 2022-08-15 MED ORDER — PROPOFOL 500 MG/50ML IV EMUL
INTRAVENOUS | Status: DC | PRN
  Administered 2022-08-15: 160 ug/kg/min via INTRAVENOUS

## 2022-08-15 MED ORDER — STERILE WATER FOR IRRIGATION IR SOLN
Status: DC | PRN
  Administered 2022-08-15: 60 mL

## 2022-08-15 MED ORDER — SODIUM CHLORIDE 0.9 % IV SOLN: INTRAVENOUS | Status: DC

## 2022-08-15 NOTE — Anesthesia Procedure Notes (Signed)
Date/Time: 08/15/2022 9:52 AM  Performed by: Demetrius Charity, CRNAPre-anesthesia Checklist: Patient identified, Emergency Drugs available, Suction available, Patient being monitored and Timeout performed Patient Re-evaluated:Patient Re-evaluated prior to induction Oxygen Delivery Method: Nasal cannula Induction Type: IV induction Placement Confirmation: CO2 detector and positive ETCO2

## 2022-08-15 NOTE — Transfer of Care (Signed)
Immediate Anesthesia Transfer of Care Note  Patient: Anne Moreno  Procedure(s) Performed: COLONOSCOPY WITH PROPOFOL  Patient Location: PACU  Anesthesia Type:General  Level of Consciousness: drowsy  Airway & Oxygen Therapy: Patient Spontanous Breathing  Post-op Assessment: Report given to RN and Post -op Vital signs reviewed and stable  Post vital signs: Reviewed and stable  Last Vitals:  Vitals Value Taken Time  BP    Temp    Pulse    Resp    SpO2      Last Pain:  Vitals:   08/15/22 0936  TempSrc: Temporal  PainSc: 0-No pain         Complications: No notable events documented.

## 2022-08-15 NOTE — H&P (Signed)
Jonathon Bellows, MD [address] [place], [place], [place], [address] [id number], [place], [place], [place], [address] Phone: [phone]  Fax: [phone]  Primary Care Physician:  Ria Bush, MD   Pre-Procedure History & Physical: HPI:  Anne Moreno is a 56 y.o. female is here for an colonoscopy.   Past Medical History:  Diagnosis Date   Pure hypercholesterolemia 05/12/2017    Past Surgical History:  Procedure Laterality Date   ABDOMINAL HYSTERECTOMY     BREAST BIOPSY Right 04/2014   benign   COLONOSCOPY WITH PROPOFOL N/A 06/28/2018   TA x3, rpt 3 yrs Vicente Males, Bailey Mech, MD)   EYE SURGERY Right 2004   double vision   FOOT SURGERY Left 08/2008   HYSTERECTOMY ABDOMINAL WITH SALPINGECTOMY  09/2011   for heavy bleeding, ovaries remain ([facility])    Prior to Admission medications   Medication Sig Start Date End Date Taking? Authorizing Provider  fexofenadine (ALLEGRA) 60 MG tablet Take 60 mg by mouth 2 (two) times daily.   Yes [provider]  Multiple Vitamin (MULTIVITAMIN PO) Take by mouth daily.   Yes [provider]    Allergies as of 07/02/2022 - Review Complete 05/15/2022  Allergen Reaction Noted   Molds & smuts Other (See Comments) 02/14/2014   Pollen extract Other (See Comments) 02/14/2014   Penicillins Nausea Only 10/08/2011    Family History  Problem Relation Age of Onset   Stomach cancer Brother 34       adopted - non biological   Emphysema Maternal Grandmother    Bladder Cancer Paternal Grandmother    Cervical cancer Sister 38       half sister    Social History   Socioeconomic History   Marital status: Single    Spouse name: Not on file   Number of children: Not on file   Years of education: Not on file   Highest education level: Not on file  Occupational History   Not on file  Tobacco Use   Smoking status: Every Day    Types: Cigarettes    Last attempt to quit: 05/20/2017    Years since quitting: 5.2    Smokeless tobacco: Never  Vaping Use   Vaping Use: Former  Substance and Sexual Activity   Alcohol use: Yes    Comment: none last 24hrs   Drug use: No   Sexual activity: Not on file  Other Topics Concern   Not on file  Social History Narrative   1 cup coffee daily   Lives with daughter and grandchild, no pets   Divorced    Boyfriend lives in [place]: Medical illustrator (Passenger transport manager)   Edu: 13 yrs   Activity: regularly works out   Diet: good water, fruits/vegetables daily   Social Determinants of Radio broadcast assistant Strain: Not on Art therapist Insecurity: Not on file  Transportation Needs: Not on file  Physical Activity: Not on file  Stress: Not on file  Social Connections: Not on file  Intimate Partner Violence: Not on file    Review of Systems: See HPI, otherwise negative ROS  Physical Exam: BP (!) 124/95   Pulse 87   Temp (!) 96.8 F (36 C) (Temporal)   Resp 16   Ht '5\' 2"'$  (1.575 m)   Wt 62.6 kg   SpO2 97%   BMI 25.24 kg/m  General:   Alert,  pleasant and cooperative in NAD Head:  Normocephalic and atraumatic. Neck:  Supple;  no masses or thyromegaly. Lungs:  Clear throughout to auscultation, normal respiratory effort.    Heart:  +S1, +S2, Regular rate and rhythm, No edema. Abdomen:  Soft, nontender and nondistended. Normal bowel sounds, without guarding, and without rebound.   Neurologic:  Alert and  oriented x4;  grossly normal neurologically.  Impression/Plan: Anne Moreno is here for an colonoscopy to be performed for surveillance due to prior history of colon polyps   Risks, benefits, limitations, and alternatives regarding  colonoscopy have been reviewed with the patient.  Questions have been answered.  All parties agreeable.   Jonathon Bellows, MD  08/15/2022, 9:42 AM

## 2022-08-15 NOTE — Anesthesia Preprocedure Evaluation (Signed)
Anesthesia Evaluation  Patient identified by MRN, date of birth, ID band Patient awake    Reviewed: Allergy & Precautions, NPO status , Patient's Chart, lab work & pertinent test results  History of Anesthesia Complications Negative for: history of anesthetic complications  Airway Mallampati: III  TM Distance: <3 FB Neck ROM: full    Dental  (+) Chipped   Pulmonary neg shortness of breath, former smoker,    Pulmonary exam normal        Cardiovascular Exercise Tolerance: Good (-) anginanegative cardio ROS Normal cardiovascular exam     Neuro/Psych negative neurological ROS  negative psych ROS   GI/Hepatic negative GI ROS, Neg liver ROS, neg GERD  ,  Endo/Other  negative endocrine ROS  Renal/GU negative Renal ROS  negative genitourinary   Musculoskeletal   Abdominal   Peds  Hematology negative hematology ROS (+)   Anesthesia Other Findings Past Medical History: 05/12/2017: Pure hypercholesterolemia  Past Surgical History: No date: ABDOMINAL HYSTERECTOMY 04/2014: BREAST BIOPSY; Right     Comment:  benign 06/28/2018: COLONOSCOPY WITH PROPOFOL; N/A     Comment:  TA x3, rpt 3 yrs Jonathon Bellows, MD) 2004: EYE SURGERY; Right     Comment:  double vision 08/2008: FOOT SURGERY; Left 09/2011: HYSTERECTOMY ABDOMINAL WITH SALPINGECTOMY     Comment:  for heavy bleeding, ovaries remain ([facility])     Reproductive/Obstetrics negative OB ROS                             Anesthesia Physical Anesthesia Plan  ASA: 2  Anesthesia Plan: General   Post-op Pain Management:    Induction:   PONV Risk Score and Plan: Propofol infusion and TIVA  Airway Management Planned:   Additional Equipment:   Intra-op Plan:   Post-operative Plan:   Informed Consent: I have reviewed the patients History and Physical, chart, labs and discussed the procedure including the risks, benefits and alternatives for  the proposed anesthesia with the patient or authorized representative who has indicated his/her understanding and acceptance.     Dental Advisory Given  Plan Discussed with: Anesthesiologist, CRNA and Surgeon  Anesthesia Plan Comments:         Anesthesia Quick Evaluation

## 2022-08-15 NOTE — Anesthesia Postprocedure Evaluation (Signed)
Anesthesia Post Note  Patient: Anne Moreno  Procedure(s) Performed: COLONOSCOPY WITH PROPOFOL  Patient location during evaluation: Endoscopy Anesthesia Type: General Level of consciousness: awake and alert Pain management: pain level controlled Vital Signs Assessment: post-procedure vital signs reviewed and stable Respiratory status: spontaneous breathing, nonlabored ventilation, respiratory function stable and patient connected to nasal cannula oxygen Cardiovascular status: blood pressure returned to baseline and stable Postop Assessment: no apparent nausea or vomiting Anesthetic complications: no   No notable events documented.   Last Vitals:  Vitals:   08/15/22 1027 08/15/22 1033  BP: 120/81 124/75  Pulse: 67 68  Resp: 16 17  Temp:    SpO2: 100% 100%    Last Pain:  Vitals:   08/15/22 1033  TempSrc:   PainSc: 0-No pain                 Precious Haws Lorrayne Ismael

## 2022-08-15 NOTE — Op Note (Signed)
[facility] Patient Name: Anne Moreno Procedure Date: 08/15/2022 9:34 AM MRN: [id number] Account #: [id number] Date of Birth: 1966-06-07 Admit Type: Outpatient Age: 56 Room: [facility] ENDO ROOM 4 Gender: Female Note Status: Finalized Instrument Name: Park Meo [id number] Procedure:             Colonoscopy Indications:           Surveillance: Personal history of adenomatous polyps                         on last colonoscopy > 3 years ago Providers:             Jonathon Bellows MD, MD Referring MD:          Ria Bush (Referring MD) Medicines:             Monitored Anesthesia Care Complications:         No immediate complications. Procedure:             Pre-Anesthesia Assessment:                        - Prior to the procedure, a History and Physical was                         performed, and patient medications, allergies and                         sensitivities were reviewed. The patient's tolerance                         of previous anesthesia was reviewed.                        - The risks and benefits of the procedure and the                         sedation options and risks were discussed with the                         patient. All questions were answered and informed                         consent was obtained.                        - ASA Grade Assessment: II - A patient with mild                         systemic disease.                        After obtaining informed consent, the colonoscope was                         passed under direct vision. Throughout the procedure,                         the patient's blood pressure, pulse, and oxygen                         saturations  were monitored continuously. The                         Colonoscope was introduced through the anus and                         advanced to the the cecum, identified by the                         appendiceal orifice. The colonoscopy was performed                          with ease. The patient tolerated the procedure well.                         The quality of the bowel preparation was excellent. Findings:      The perianal and digital rectal examinations were normal.      Two sessile polyps were found in the sigmoid colon and descending colon.       The polyps were 4 to 5 mm in size. These polyps were removed with a cold       snare. Resection and retrieval were complete.      The exam was otherwise without abnormality on direct and retroflexion       views. Impression:            - Two 4 to 5 mm polyps in the sigmoid colon and in the                         descending colon, removed with a cold snare. Resected                         and retrieved.                        - The examination was otherwise normal on direct and                         retroflexion views. Recommendation:        - Discharge patient to home (with escort).                        - Resume previous diet.                        - Continue present medications.                        - Await pathology results.                        - Repeat colonoscopy for surveillance based on                         pathology results. Procedure Code(s):     --- Professional ---                        [phone], Colonoscopy, flexible; with removal of  tumor(s), polyp(s), or other lesion(s) by snare                         technique Diagnosis Code(s):     --- Professional ---                        Z86.010, Personal history of colonic polyps                        D12.5, Benign neoplasm of sigmoid colon                        D12.4, Benign neoplasm of descending colon CPT copyright 2022 American Medical Association. All rights reserved. The codes documented in this report are preliminary and upon coder review may  be revised to meet current compliance requirements. Jonathon Bellows, MD Jonathon Bellows MD, MD 08/15/2022 10:10:59 AM This report has been signed  electronically. Number of Addenda: 0 Note Initiated On: 08/15/2022 9:34 AM Scope Withdrawal Time: 0 hours 8 minutes 56 seconds  Total Procedure Duration: 0 hours 13 minutes 29 seconds  Estimated Blood Loss:  Estimated blood loss: none.      [facility]

## 2022-08-18 ENCOUNTER — Encounter: Payer: Self-pay | Admitting: Gastroenterology

## 2022-08-18 LAB — SURGICAL PATHOLOGY

## 2022-08-19 ENCOUNTER — Encounter: Payer: Self-pay | Admitting: Family Medicine

## 2022-10-02 ENCOUNTER — Other Ambulatory Visit: Payer: Self-pay | Admitting: Family Medicine

## 2022-10-02 DIAGNOSIS — Z1231 Encounter for screening mammogram for malignant neoplasm of breast: Secondary | ICD-10-CM

## 2022-11-12 ENCOUNTER — Ambulatory Visit
Admission: RE | Admit: 2022-11-12 | Discharge: 2022-11-12 | Disposition: A | Discharge status: Home / Self Care | Payer: Commercial Managed Care - PPO | Source: Ambulatory Visit | Attending: Family Medicine | Admitting: Family Medicine

## 2022-11-12 DIAGNOSIS — Z1231 Encounter for screening mammogram for malignant neoplasm of breast: Secondary | ICD-10-CM | POA: Diagnosis not present

## 2023-09-24 ENCOUNTER — Other Ambulatory Visit: Payer: Self-pay | Admitting: Family Medicine

## 2023-09-24 DIAGNOSIS — E7800 Pure hypercholesterolemia, unspecified: Secondary | ICD-10-CM

## 2023-09-24 DIAGNOSIS — E213 Hyperparathyroidism, unspecified: Secondary | ICD-10-CM

## 2023-09-26 ENCOUNTER — Encounter: Payer: Self-pay | Admitting: Family Medicine

## 2023-09-30 ENCOUNTER — Other Ambulatory Visit (INDEPENDENT_AMBULATORY_CARE_PROVIDER_SITE_OTHER): Payer: Commercial Managed Care - PPO

## 2023-09-30 DIAGNOSIS — E7800 Pure hypercholesterolemia, unspecified: Secondary | ICD-10-CM

## 2023-09-30 DIAGNOSIS — E213 Hyperparathyroidism, unspecified: Secondary | ICD-10-CM | POA: Diagnosis not present

## 2023-09-30 LAB — COMPREHENSIVE METABOLIC PANEL
ALT: 9 U/L (ref 0–35)
AST: 15 U/L (ref 0–37)
Albumin: 4.8 g/dL (ref 3.5–5.2)
Alkaline Phosphatase: 54 U/L (ref 39–117)
BUN: 9 mg/dL (ref 6–23)
CO2: 32 meq/L (ref 19–32)
Calcium: 9.7 mg/dL (ref 8.4–10.5)
Chloride: 103 meq/L (ref 96–112)
Creatinine, Ser: 0.9 mg/dL (ref 0.40–1.20)
GFR: 70.83 mL/min (ref >60.00)
Glucose, Bld: 99 mg/dL (ref 70–99)
Potassium: 4.5 meq/L (ref 3.5–5.1)
Sodium: 143 meq/L (ref 135–145)
Total Bilirubin: 0.6 mg/dL (ref 0.2–1.2)
Total Protein: 7 g/dL (ref 6.0–8.3)

## 2023-09-30 LAB — LIPID PANEL
Cholesterol: 250 mg/dL — ABNORMAL HIGH (ref 0–200)
HDL: 70.1 mg/dL (ref >39.00)
LDL Cholesterol: 146 mg/dL — ABNORMAL HIGH (ref 0–99)
NonHDL: 179.89
Total CHOL/HDL Ratio: 4
Triglycerides: 169 mg/dL — ABNORMAL HIGH (ref 0.0–149.0)
VLDL: 33.8 mg/dL (ref 0.0–40.0)

## 2023-09-30 LAB — TSH: TSH: 2.1 u[IU]/mL (ref 0.35–5.50)

## 2023-09-30 LAB — VITAMIN D 25 HYDROXY (VIT D DEFICIENCY, FRACTURES): VITD: 41.42 ng/mL (ref 30.00–100.00)

## 2023-10-01 LAB — PARATHYROID HORMONE, INTACT (NO CA): PTH: 182 pg/mL — ABNORMAL HIGH (ref 16–77)

## 2023-10-07 ENCOUNTER — Ambulatory Visit (INDEPENDENT_AMBULATORY_CARE_PROVIDER_SITE_OTHER): Payer: Commercial Managed Care - PPO | Admitting: Family Medicine

## 2023-10-07 ENCOUNTER — Encounter: Payer: Self-pay | Admitting: Family Medicine

## 2023-10-07 VITALS — BP 124/84 | HR 69 | Temp 97.9°F | Ht 62.5 in | Wt 146.4 lb

## 2023-10-07 DIAGNOSIS — E782 Mixed hyperlipidemia: Secondary | ICD-10-CM

## 2023-10-07 DIAGNOSIS — E213 Hyperparathyroidism, unspecified: Secondary | ICD-10-CM | POA: Diagnosis not present

## 2023-10-07 DIAGNOSIS — Z0000 Encounter for general adult medical examination without abnormal findings: Secondary | ICD-10-CM

## 2023-10-07 MED ORDER — FEXOFENADINE HCL 180 MG PO TABS: 180.0000 mg | ORAL_TABLET | Freq: Every day | ORAL | Status: AC

## 2023-10-07 NOTE — Assessment & Plan Note (Addendum)
Chronic, deterioration noted. Reviewed diet choices to improve cholesterol control in detail. The 10-year ASCVD risk score (Arnett DK, et al., 2019) is: 2.3%   Values used to calculate the score:     Age: 57 years     Sex: Female     Is Non-Hispanic African American: No     Diabetic: No     Tobacco smoker: No     Systolic Blood Pressure: 124 mmHg     Is BP treated: No     HDL Cholesterol: 70.1 mg/dL     Total Cholesterol: 250 mg/dL

## 2023-10-07 NOTE — Progress Notes (Signed)
Ph: [phone] Fax: [phone]   Patient ID: Anne Moreno, female    DOB: [birth year], 57 y.o.   MRN: [id number]  This visit was conducted in person.  BP 124/84   Pulse 69   Temp 97.9 F (36.6 C) (Oral)   Ht 5' 2.5" (1.588 m)   Wt 146 lb 6 oz (66.4 kg)   SpO2 96%   BMI 26.35 kg/m    CC: CPE Subjective:   HPI: Anne Moreno is a 57 y.o. female presenting on 10/07/2023 for Annual Exam   H/o RLS overall improved. Gabapentin and Requip were not effective.    Elevated PTH levels - see below.  No dysphagia, significant hoarseness.  H/o remote kidney stone x1.  No known fmhx hyperparathyroidism.   Preventative: COLONOSCOPY WITH PROPOFOL 06/28/2018 - TA x3, rpt 3 yrs Anne Moreno, Anne Salina, MD) - upcoming colonoscopy next week.  COLONOSCOPY WITH PROPOFOL 08/15/2022 - TA x2, rpt 7 yrs(Anne Moreno, Kiran, MD) Well woman - [facility], last pelvic exam 2017. S/p hysterectomy 2012 for heavy bleeding, cervix removed, ovaries remain. Menopausal symptoms have improved. Never took HRT.  Mammogram 10/2022 @ [facility]: benign R breast calcifications. Due for rpt.  Lung cancer screening - not eligible  Flu shot - doesn't receive.  COVID vaccine - declined.  Td 1996, Tdap 2018  Shingrix - discussed, declines.  Seat belt use discussed  Sunscreen use discussed. No changing moles on skin.  Sleep - averaging 6-7 hrs/night  Ex smoker. Quit full time smoking ~2015 (social smoking). <10 PY hx.  Alcohol - 2 drinks a few nights a week  Dentist Q6 mo  Eye exam - yearly  G2P2    1 cup coffee daily Lives with daughter and grandchild, no pets Divorced, boyfriend lives in [place] Occ: Advertising account planner (Scientist, water quality) Edu: 13 yrs Activity: no regular exercise  Diet: good water, fruits/vegetables daily      Relevant past medical, surgical, family and social history reviewed and updated as indicated. Interim medical history since our last visit reviewed. Allergies and  medications reviewed and updated. Outpatient Medications Prior to Visit  Medication Sig Dispense Refill   Multiple Vitamin (MULTIVITAMIN PO) Take by mouth daily.     fexofenadine (ALLEGRA) 60 MG tablet Take 60 mg by mouth 2 (two) times daily.     No facility-administered medications prior to visit.     Per HPI unless specifically indicated in ROS section below Review of Systems  Constitutional:  Negative for activity change, appetite change, chills, fatigue, fever and unexpected weight change.  HENT:  Negative for hearing loss.   Eyes:  Negative for visual disturbance.  Respiratory:  Negative for cough, chest tightness, shortness of breath and wheezing.   Cardiovascular:  Negative for chest pain, palpitations and leg swelling.  Gastrointestinal:  Negative for abdominal distention, abdominal pain, blood in stool, constipation, diarrhea, nausea and vomiting.  Genitourinary:  Negative for difficulty urinating and hematuria.  Musculoskeletal:  Negative for arthralgias, myalgias and neck pain.  Skin:  Negative for rash.  Neurological:  Negative for dizziness, seizures, syncope and headaches.  Hematological:  Negative for adenopathy. Does not bruise/bleed easily.  Psychiatric/Behavioral:  Negative for dysphoric mood. The patient is not nervous/anxious.     Objective:  BP 124/84   Pulse 69   Temp 97.9 F (36.6 C) (Oral)   Ht 5' 2.5" (1.588 m)   Wt 146 lb 6 oz (66.4 kg)   SpO2 96%   BMI 26.35 kg/m   Wt  Readings from Last 3 Encounters:  10/07/23 146 lb 6 oz (66.4 kg)  08/15/22 138 lb (62.6 kg)  08/04/22 142 lb 2 oz (64.5 kg)      Physical Exam Vitals and nursing note reviewed.  Constitutional:      Appearance: Normal appearance. She is not ill-appearing.  HENT:     Head: Normocephalic and atraumatic.     Moreno Ear: Tympanic membrane, ear canal and external ear normal. There is no impacted cerumen.     Left Ear: Tympanic membrane, ear canal and external ear normal. There is no  impacted cerumen.     Mouth/Throat:     Mouth: Mucous membranes are moist.     Pharynx: Oropharynx is clear. No oropharyngeal exudate or posterior oropharyngeal erythema.  Eyes:     General:        Moreno eye: No discharge.        Left eye: No discharge.     Extraocular Movements: Extraocular movements intact.     Conjunctiva/sclera: Conjunctivae normal.     Pupils: Pupils are equal, round, and reactive to light.  Neck:     Thyroid: No thyroid mass or thyromegaly.  Cardiovascular:     Rate and Rhythm: Normal rate and regular rhythm.     Pulses: Normal pulses.     Heart sounds: Normal heart sounds. No murmur heard. Pulmonary:     Effort: Pulmonary effort is normal. No respiratory distress.     Breath sounds: Normal breath sounds. No wheezing, rhonchi or rales.  Abdominal:     General: Bowel sounds are normal. There is no distension.     Palpations: Abdomen is soft. There is no mass.     Tenderness: There is no abdominal tenderness. There is no guarding or rebound.     Hernia: No hernia is present.  Musculoskeletal:     Cervical back: Normal range of motion and neck supple. No rigidity.     Moreno lower leg: No edema.     Left lower leg: No edema.  Lymphadenopathy:     Cervical: No cervical adenopathy.  Skin:    General: Skin is warm and dry.     Findings: No rash.  Neurological:     General: No focal deficit present.     Mental Status: She is alert. Mental status is at baseline.  Psychiatric:        Mood and Affect: Mood normal.        Behavior: Behavior normal.       Results for orders placed or performed in visit on 09/30/23  TSH   Collection Time: 09/30/23  8:14 AM  Result Value Ref Range   TSH 2.10 0.35 - 5.50 uIU/mL  VITAMIN D 25 Hydroxy (Vit-D Deficiency, Fractures)   Collection Time: 09/30/23  8:14 AM  Result Value Ref Range   VITD 41.42 30.00 - 100.00 ng/mL  Parathyroid hormone, intact (no Ca)   Collection Time: 09/30/23  8:14 AM  Result Value Ref Range    PTH 182 (H) 16 - 77 pg/mL  Comprehensive metabolic panel   Collection Time: 09/30/23  8:14 AM  Result Value Ref Range   Sodium 143 135 - 145 mEq/L   Potassium 4.5 3.5 - 5.1 mEq/L   Chloride 103 96 - 112 mEq/L   CO2 32 19 - 32 mEq/L   Glucose, Bld 99 70 - 99 mg/dL   BUN 9 6 - 23 mg/dL   Creatinine, Ser 8.65 0.40 - 1.20 mg/dL   Total  Bilirubin 0.6 0.2 - 1.2 mg/dL   Alkaline Phosphatase 54 39 - 117 U/L   AST 15 0 - 37 U/L   ALT 9 0 - 35 U/L   Total Protein 7.0 6.0 - 8.3 g/dL   Albumin 4.8 3.5 - 5.2 g/dL   GFR 16.10 >96.04 mL/min   Calcium 9.7 8.4 - 10.5 mg/dL  Lipid panel   Collection Time: 09/30/23  8:14 AM  Result Value Ref Range   Cholesterol 250 (H) 0 - 200 mg/dL   Triglycerides 540.9 (H) 0.0 - 149.0 mg/dL   HDL 81.19 >14.78 mg/dL   VLDL 29.5 0.0 - 62.1 mg/dL   LDL Cholesterol 308 (H) 0 - 99 mg/dL   Total CHOL/HDL Ratio 4    NonHDL 179.89     Assessment & Plan:   Problem List Items Addressed This Visit     Health maintenance examination - Primary (Chronic)   Preventative protocols reviewed and updated unless pt declined. Discussed healthy diet and lifestyle.       Hyperlipidemia   Chronic, deterioration noted. Reviewed diet choices to improve cholesterol control in detail. The 10-year ASCVD risk score (Arnett DK, et al., 2019) is: 2.3%   Values used to calculate the score:     Age: 71 years     Sex: Female     Is Non-Hispanic African American: No     Diabetic: No     Tobacco smoker: No     Systolic Blood Pressure: 124 mmHg     Is BP treated: No     HDL Cholesterol: 70.1 mg/dL     Total Cholesterol: 250 mg/dL       Hyperparathyroidism (HCC)   Anticipate primary hyperparathyroidism. Discussed with patient as well as treatment options, surgical evaluation, endocrinology evaluation.  Given significant PTH elevation and young age, will refer to general surgery for further evaluation/management. No h/o kidney disease. No fmhx parathyroid disease. Vit D levels  normal off regular replacement, only on MVI. No current symptoms but has had remote h/o kidney stone.  Will also order baseline DEXA scan.      Relevant Orders   DG Bone Density   Ambulatory referral to General Surgery     Meds ordered this encounter  Medications   fexofenadine (ALLEGRA ALLERGY) 180 MG tablet    Sig: Take 1 tablet (180 mg total) by mouth daily.    Orders Placed This Encounter  Procedures   DG Bone Density    Standing Status:   Future    Expiration Date:   10/06/2024    Reason for Exam (SYMPTOM  OR DIAGNOSIS REQUIRED):   hyperparathyroidism    Is patient pregnant?:   No    Preferred imaging location?:   [facility]   Ambulatory referral to General Surgery    Referral Priority:   Routine    Referral Type:   Surgical    Referral Reason:   Specialty Services Required    Requested Specialty:   General Surgery    Number of Visits Requested:   1    Patient Instructions  I have ordered bone density scan - try to get done at same time as upcoming mammogram.  We will refer you to [facility] surgeon for further evaluation of elevated parathyroid levels, handout provided today  Good to see you today Return as needed or in 1 year for next physical.   Follow up plan: Return in about 1 year (around 10/06/2024) for annual exam, prior fasting for blood work.  Eustaquio Boyden, MD

## 2023-10-07 NOTE — Patient Instructions (Addendum)
I have ordered bone density scan - try to get done at same time as upcoming mammogram.  We will refer you to [facility] surgeon for further evaluation of elevated parathyroid levels, handout provided today  Good to see you today Return as needed or in 1 year for next physical.

## 2023-10-07 NOTE — Assessment & Plan Note (Signed)
Preventative protocols reviewed and updated unless pt declined. Discussed healthy diet and lifestyle.  

## 2023-10-07 NOTE — Assessment & Plan Note (Addendum)
Anticipate primary hyperparathyroidism. Discussed with patient as well as treatment options, surgical evaluation, endocrinology evaluation.  Given significant PTH elevation and young age, will refer to general surgery for further evaluation/management. No h/o kidney disease. No fmhx parathyroid disease. Vit D levels normal off regular replacement, only on MVI. No current symptoms but has had remote h/o kidney stone.  Will also order baseline DEXA scan.

## 2023-10-09 ENCOUNTER — Other Ambulatory Visit: Payer: Self-pay | Admitting: Family Medicine

## 2023-10-09 DIAGNOSIS — Z1231 Encounter for screening mammogram for malignant neoplasm of breast: Secondary | ICD-10-CM

## 2023-10-10 ENCOUNTER — Encounter: Payer: Self-pay | Admitting: *Deleted

## 2023-11-17 ENCOUNTER — Ambulatory Visit
Admission: RE | Admit: 2023-11-17 | Discharge: 2023-11-17 | Disposition: A | Discharge status: Home / Self Care | Payer: Commercial Managed Care - PPO | Source: Ambulatory Visit | Attending: Family Medicine | Admitting: Family Medicine

## 2023-11-17 DIAGNOSIS — Z1231 Encounter for screening mammogram for malignant neoplasm of breast: Secondary | ICD-10-CM | POA: Insufficient documentation

## 2023-11-17 DIAGNOSIS — E213 Hyperparathyroidism, unspecified: Secondary | ICD-10-CM | POA: Diagnosis present

## 2023-11-21 ENCOUNTER — Encounter: Payer: Self-pay | Admitting: Family Medicine

## 2023-11-21 DIAGNOSIS — M810 Age-related osteoporosis without current pathological fracture: Secondary | ICD-10-CM | POA: Insufficient documentation

## 2023-12-11 ENCOUNTER — Other Ambulatory Visit: Payer: Self-pay | Admitting: Surgery

## 2023-12-11 DIAGNOSIS — E210 Primary hyperparathyroidism: Secondary | ICD-10-CM

## 2023-12-21 ENCOUNTER — Ambulatory Visit
Admission: RE | Admit: 2023-12-21 | Discharge: 2023-12-21 | Disposition: A | Discharge status: Home / Self Care | Payer: Commercial Managed Care - PPO | Source: Ambulatory Visit | Attending: Surgery | Admitting: Surgery

## 2023-12-21 ENCOUNTER — Encounter
Admission: RE | Admit: 2023-12-21 | Discharge: 2023-12-21 | Disposition: A | Discharge status: Home / Self Care | Payer: Commercial Managed Care - PPO | Source: Ambulatory Visit | Attending: Surgery | Admitting: Surgery

## 2023-12-21 DIAGNOSIS — E210 Primary hyperparathyroidism: Secondary | ICD-10-CM | POA: Diagnosis present

## 2023-12-21 MED ORDER — TECHNETIUM TC 99M SESTAMIBI - CARDIOLITE
25.5700 | Freq: Once | INTRAVENOUS | Status: AC | PRN
  Administered 2023-12-21: 25.57 via INTRAVENOUS

## 2024-01-05 ENCOUNTER — Encounter: Payer: Self-pay | Admitting: Surgery

## 2024-01-05 NOTE — Progress Notes (Signed)
 Both the USN and the sestamibi scan are negative for parathyroid adenoma.  Will plan to proceed with 4D-CT scan as we discussed in the office.  Tresa Endo - please arrange for 4D-CT scan.  Darnell Level, MD [facility] practice Office: [phone]

## 2024-01-07 ENCOUNTER — Other Ambulatory Visit: Payer: Self-pay | Admitting: Surgery

## 2024-01-07 DIAGNOSIS — E210 Primary hyperparathyroidism: Secondary | ICD-10-CM

## 2024-01-14 ENCOUNTER — Ambulatory Visit
Admission: RE | Admit: 2024-01-14 | Discharge: 2024-01-14 | Disposition: A | Discharge status: Home / Self Care | Source: Ambulatory Visit | Attending: Surgery | Admitting: Surgery

## 2024-01-14 DIAGNOSIS — E210 Primary hyperparathyroidism: Secondary | ICD-10-CM | POA: Insufficient documentation

## 2024-01-14 MED ORDER — SODIUM CHLORIDE 0.9 % IV SOLN: INTRAVENOUS | Status: DC

## 2024-01-14 MED ORDER — IOHEXOL 300 MG/ML  SOLN
75.0000 mL | Freq: Once | INTRAMUSCULAR | Status: AC | PRN
  Administered 2024-01-14: 75 mL via INTRAVENOUS

## 2024-02-03 ENCOUNTER — Encounter: Payer: Self-pay | Admitting: Surgery

## 2024-02-03 NOTE — Progress Notes (Signed)
 I sent this report to Dr. Mariam Shingles to make referral regarding the paraspinal lesion to a neurosurgeon.  USN, sestamibi scan, and 4D CT are all negative for parathyroid adenoma.  I will schedule patient for an office visit to review results and discuss further options for management.  Loetta Ringer - please contact patient and arrange follow up office visit.  Oralee Billow, MD [facility] practice Office: [phone]

## 2024-03-15 ENCOUNTER — Telehealth: Payer: Self-pay | Admitting: Family Medicine

## 2024-03-15 DIAGNOSIS — R222 Localized swelling, mass and lump, trunk: Secondary | ICD-10-CM | POA: Insufficient documentation

## 2024-03-15 DIAGNOSIS — G96191 Perineural cyst: Secondary | ICD-10-CM | POA: Insufficient documentation

## 2024-03-15 NOTE — Telephone Encounter (Signed)
 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 Radiology recommended further evaluation with thoracic spine MRI - to let us  know if she agrees and I will order. Alternatively we could send her to spine surgeon for further evaluation (if having symptoms).    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 A thoracic spine MRI (with and without contrast) is recommended for further evaluation."

## 2024-03-15 NOTE — Telephone Encounter (Signed)
 Spoke with pt relaying Dr Ocie Belt message. Asked pt about  R sided thoracic back pain or numbness/weakness of R arm. Denies pain in arm but c/o R shoulder pain radiating up into R side of neck. Pt agrees to thoracic spine MRI.

## 2024-03-16 NOTE — Addendum Note (Signed)
 Addended by: Claire Crick on: 03/16/2024 08:02 AM   Modules accepted: Orders

## 2024-03-16 NOTE — Telephone Encounter (Signed)
 MRI ordered for [place].

## 2024-03-18 ENCOUNTER — Encounter: Payer: Self-pay | Admitting: *Deleted

## 2024-04-07 ENCOUNTER — Ambulatory Visit
Admission: RE | Admit: 2024-04-07 | Discharge: 2024-04-07 | Disposition: A | Discharge status: Home / Self Care | Source: Ambulatory Visit | Attending: Family Medicine | Admitting: Family Medicine

## 2024-04-07 DIAGNOSIS — R222 Localized swelling, mass and lump, trunk: Secondary | ICD-10-CM | POA: Diagnosis present

## 2024-04-07 MED ORDER — GADOBUTROL 1 MMOL/ML IV SOLN
6.0000 mL | Freq: Once | INTRAVENOUS | Status: AC | PRN
  Administered 2024-04-07: 6 mL via INTRAVENOUS

## 2024-04-23 ENCOUNTER — Ambulatory Visit: Payer: Self-pay | Admitting: Family Medicine

## 2024-04-23 DIAGNOSIS — G96191 Perineural cyst: Secondary | ICD-10-CM

## 2024-09-26 ENCOUNTER — Other Ambulatory Visit: Payer: Self-pay | Admitting: Family Medicine

## 2024-09-26 DIAGNOSIS — E213 Hyperparathyroidism, unspecified: Secondary | ICD-10-CM

## 2024-09-26 DIAGNOSIS — E782 Mixed hyperlipidemia: Secondary | ICD-10-CM

## 2024-09-26 DIAGNOSIS — M810 Age-related osteoporosis without current pathological fracture: Secondary | ICD-10-CM

## 2024-09-30 ENCOUNTER — Other Ambulatory Visit: Payer: Commercial Managed Care - PPO

## 2024-09-30 DIAGNOSIS — E782 Mixed hyperlipidemia: Secondary | ICD-10-CM | POA: Diagnosis not present

## 2024-09-30 DIAGNOSIS — E213 Hyperparathyroidism, unspecified: Secondary | ICD-10-CM

## 2024-09-30 DIAGNOSIS — M810 Age-related osteoporosis without current pathological fracture: Secondary | ICD-10-CM

## 2024-09-30 LAB — CBC WITH DIFFERENTIAL/PLATELET
Basophils Absolute: 0 K/uL (ref 0.0–0.1)
Basophils Relative: 0.7 % (ref 0.0–3.0)
Eosinophils Absolute: 0.1 K/uL (ref 0.0–0.7)
Eosinophils Relative: 2.8 % (ref 0.0–5.0)
HCT: 40.3 % (ref 36.0–46.0)
Hemoglobin: 13.6 g/dL (ref 12.0–15.0)
Lymphocytes Relative: 33.2 % (ref 12.0–46.0)
Lymphs Abs: 1.5 K/uL (ref 0.7–4.0)
MCHC: 33.6 g/dL (ref 30.0–36.0)
MCV: 89.3 fl (ref 78.0–100.0)
Monocytes Absolute: 0.4 K/uL (ref 0.1–1.0)
Monocytes Relative: 8.6 % (ref 3.0–12.0)
Neutro Abs: 2.5 K/uL (ref 1.4–7.7)
Neutrophils Relative %: 54.7 % (ref 43.0–77.0)
Platelets: 232 K/uL (ref 150.0–400.0)
RBC: 4.52 Mil/uL (ref 3.87–5.11)
RDW: 12.9 % (ref 11.5–15.5)
WBC: 4.5 K/uL (ref 4.0–10.5)

## 2024-09-30 LAB — LIPID PANEL
Cholesterol: 216 mg/dL — ABNORMAL HIGH (ref 0–200)
HDL: 64.2 mg/dL (ref >39.00)
LDL Cholesterol: 120 mg/dL — ABNORMAL HIGH (ref 0–99)
NonHDL: 151.93
Total CHOL/HDL Ratio: 3
Triglycerides: 159 mg/dL — ABNORMAL HIGH (ref 0.0–149.0)
VLDL: 31.8 mg/dL (ref 0.0–40.0)

## 2024-09-30 LAB — COMPREHENSIVE METABOLIC PANEL WITH GFR
ALT: 8 U/L (ref 0–35)
AST: 16 U/L (ref 0–37)
Albumin: 4.6 g/dL (ref 3.5–5.2)
Alkaline Phosphatase: 52 U/L (ref 39–117)
BUN: 16 mg/dL (ref 6–23)
CO2: 31 meq/L (ref 19–32)
Calcium: 9.9 mg/dL (ref 8.4–10.5)
Chloride: 103 meq/L (ref 96–112)
Creatinine, Ser: 0.87 mg/dL (ref 0.40–1.20)
GFR: 73.25 mL/min (ref >60.00)
Glucose, Bld: 91 mg/dL (ref 70–99)
Potassium: 4.5 meq/L (ref 3.5–5.1)
Sodium: 142 meq/L (ref 135–145)
Total Bilirubin: 0.7 mg/dL (ref 0.2–1.2)
Total Protein: 6.7 g/dL (ref 6.0–8.3)

## 2024-09-30 LAB — VITAMIN D 25 HYDROXY (VIT D DEFICIENCY, FRACTURES): VITD: 40.28 ng/mL (ref 30.00–100.00)

## 2024-09-30 LAB — TSH: TSH: 2.5 u[IU]/mL (ref 0.35–5.50)

## 2024-10-01 LAB — PARATHYROID HORMONE, INTACT (NO CA): PTH: 44 pg/mL (ref 16–77)

## 2024-10-03 ENCOUNTER — Ambulatory Visit: Payer: Self-pay | Admitting: Family Medicine

## 2024-10-03 LAB — CALCIUM, IONIZED: Calcium, Ion: 5.2 mg/dL (ref 4.7–5.5)

## 2024-10-07 ENCOUNTER — Ambulatory Visit (INDEPENDENT_AMBULATORY_CARE_PROVIDER_SITE_OTHER): Payer: Commercial Managed Care - PPO | Admitting: Family Medicine

## 2024-10-07 ENCOUNTER — Encounter: Payer: Self-pay | Admitting: Family Medicine

## 2024-10-07 VITALS — BP 132/84 | HR 85 | Temp 98.0°F | Ht 62.25 in | Wt 137.2 lb

## 2024-10-07 DIAGNOSIS — G96191 Perineural cyst: Secondary | ICD-10-CM | POA: Diagnosis not present

## 2024-10-07 DIAGNOSIS — E213 Hyperparathyroidism, unspecified: Secondary | ICD-10-CM

## 2024-10-07 DIAGNOSIS — E782 Mixed hyperlipidemia: Secondary | ICD-10-CM | POA: Diagnosis not present

## 2024-10-07 DIAGNOSIS — Z0000 Encounter for general adult medical examination without abnormal findings: Secondary | ICD-10-CM | POA: Diagnosis not present

## 2024-10-07 DIAGNOSIS — M810 Age-related osteoporosis without current pathological fracture: Secondary | ICD-10-CM | POA: Diagnosis not present

## 2024-10-07 NOTE — Assessment & Plan Note (Signed)
 Chronic, stable off medication. Overall improved cholesterol control this year. She has worked some on [employer] changes this year.  The 10-year ASCVD risk score (Arnett DK, et al., 2019) is: 2.7%   Values used to calculate the score:     Age: 58 years     Clinically relevant sex: Female     Is Non-Hispanic African American: No     Diabetic: No     Tobacco smoker: No     Systolic Blood Pressure: 132 mmHg     Is BP treated: No     HDL Cholesterol: 64.2 mg/dL     Total Cholesterol: 216 mg/dL

## 2024-10-07 NOTE — Assessment & Plan Note (Addendum)
 Surprisingly PTH, ca, vit D all back to normal.  Will forward results to Dr Eletha.

## 2024-10-07 NOTE — Patient Instructions (Addendum)
 Good to see you today I will order thoracic MRI for follow up spine cyst.  Latest labs were overall improved - I do recommend rechecking parathyroid  levels in 3-4 months - schedule lab visit for this.  See osteoporosis handout. Ensure good dietary calcium intake.  Return as needed or in 1 year for next physical.

## 2024-10-07 NOTE — Assessment & Plan Note (Signed)
 Preventative protocols reviewed and updated unless pt declined. Discussed healthy diet and lifestyle.

## 2024-10-07 NOTE — Progress Notes (Unsigned)
 " Ph: [phone] Fax: [phone]   Patient ID: Anne Moreno, female    DOB: 1965-12-10, 58 y.o.   MRN: [id number]  This visit was conducted in person.  BP 132/84   Pulse 85   Temp 98 F (36.7 C) (Oral)   Ht 5' 2.25 (1.581 m)   Wt 137 lb 4 oz (62.3 kg)   SpO2 98%   BMI 24.90 kg/m    CC: CPE Subjective:   HPI: Anne Moreno is a 58 y.o. female presenting on 10/07/2024 for Annual Exam   H/o RLS overall improved. Gabapentin  and Requip  were not effective.   Got COVID for the first time while on cruise 05/2024.   Hyperparathyroidism - saw gen surgery, unrevealing work-up for source - rec exploratory surgery.  H/o remote kidney stone x1.  Osteoporosis recently diagnosed  No known fmhx hyperparathyroidism.   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   Preventative: COLONOSCOPY WITH PROPOFOL  06/28/2018 - TA x3, rpt 3 yrs Romero, Ruel, MD) - upcoming colonoscopy next week.  COLONOSCOPY WITH PROPOFOL  08/15/2022 - TA x2, rpt 7 yrs(Anna, Kiran, MD) Well woman - [facility], last pelvic exam 2017. S/p hysterectomy 2012 for heavy bleeding, cervix removed, ovaries remain. Menopausal symptoms have improved. Never took HRT.  Mammogram 10/2023 @ [facility] - [facility] DEXA 10/2023 - T -3.3 spine, -2.3 RFN - discussed.  Lung cancer screening - not eligible  Flu shot - doesn't receive.  Prevnar-20 - declines COVID vaccine - declined.  Td 1996, Tdap 2018  Shingrix - discussed, declines.  Seat belt use discussed  Sunscreen use discussed. No changing moles on skin.  Sleep - averaging 6-7 hrs/night  Ex smoker. Quit full time smoking ~2015 (social smoking). <10 PY hx.  Alcohol - 2 drinks a few nights a week  Dentist Q6 mo  Eye exam - yearly  G2P2   1 cup coffee daily Lives with daughter and grandchild, no pets Divorced, boyfriend lives in [place]  Occ: advertising account planner (scientist, water quality) Edu: 13  yrs Activity: no regular exercise  Diet: good water , fruits/vegetables daily      Relevant past medical, surgical, family and social history reviewed and updated as indicated. Interim medical history since our last visit reviewed. Allergies and medications reviewed and updated. Outpatient Medications Prior to Visit  Medication Sig Dispense Refill   fexofenadine  (ALLEGRA  ALLERGY) 180 MG tablet Take 1 tablet (180 mg total) by mouth daily.     Multiple Vitamin (MULTIVITAMIN PO) Take by mouth daily.     No facility-administered medications prior to visit.     Per HPI unless specifically indicated in ROS section below Review of Systems  Constitutional:  Negative for activity change, appetite change, chills, fatigue, fever and unexpected weight change.  HENT:  Negative for hearing loss.   Eyes:  Negative for visual disturbance.  Respiratory:  Negative for cough, chest tightness, shortness of breath and wheezing.   Cardiovascular:  Negative for chest pain, palpitations and leg swelling.  Gastrointestinal:  Negative for abdominal distention, abdominal pain, blood in stool, constipation, diarrhea, nausea and vomiting.  Genitourinary:  Negative for difficulty urinating and hematuria.  Musculoskeletal:  Negative for arthralgias, myalgias and neck pain.  Skin:  Negative for rash.  Neurological:  Negative for dizziness, seizures, syncope and headaches.  Hematological:  Negative for adenopathy. Does not bruise/bleed easily.  Psychiatric/Behavioral:  Negative for dysphoric mood. The patient is not nervous/anxious.  Objective:  BP 132/84   Pulse 85   Temp 98 F (36.7 C) (Oral)   Ht 5' 2.25 (1.581 m)   Wt 137 lb 4 oz (62.3 kg)   SpO2 98%   BMI 24.90 kg/m   Wt Readings from Last 3 Encounters:  10/07/24 137 lb 4 oz (62.3 kg)  10/07/23 146 lb 6 oz (66.4 kg)  08/15/22 138 lb (62.6 kg)      Physical Exam Vitals and nursing note reviewed.  Constitutional:      Appearance: Normal  appearance. She is not ill-appearing.  HENT:     Head: Normocephalic and atraumatic.     Right Ear: Tympanic membrane, ear canal and external ear normal. There is no impacted cerumen.     Left Ear: Tympanic membrane, ear canal and external ear normal. There is no impacted cerumen.     Mouth/Throat:     Mouth: Mucous membranes are moist.     Pharynx: Oropharynx is clear. No oropharyngeal exudate or posterior oropharyngeal erythema.  Eyes:     General:        Right eye: No discharge.        Left eye: No discharge.     Extraocular Movements: Extraocular movements intact.     Conjunctiva/sclera: Conjunctivae normal.     Pupils: Pupils are equal, round, and reactive to light.  Neck:     Thyroid : No thyroid  mass or thyromegaly.  Cardiovascular:     Rate and Rhythm: Normal rate and regular rhythm.     Pulses: Normal pulses.     Heart sounds: Normal heart sounds. No murmur heard. Pulmonary:     Effort: Pulmonary effort is normal. No respiratory distress.     Breath sounds: Normal breath sounds. No wheezing, rhonchi or rales.  Abdominal:     General: Bowel sounds are normal. There is no distension.     Palpations: Abdomen is soft. There is no mass.     Tenderness: There is no abdominal tenderness. There is no guarding or rebound.     Hernia: No hernia is present.  Musculoskeletal:     Cervical back: Normal range of motion and neck supple. No rigidity.     Right lower leg: No edema.     Left lower leg: No edema.  Lymphadenopathy:     Cervical: No cervical adenopathy.  Skin:    General: Skin is warm and dry.     Findings: No rash.  Neurological:     General: No focal deficit present.     Mental Status: She is alert. Mental status is at baseline.  Psychiatric:        Mood and Affect: Mood normal.        Behavior: Behavior normal.       Results for orders placed or performed in visit on 09/30/24  Calcium, ionized   Collection Time: 09/30/24  8:00 AM  Result Value Ref Range    Calcium, Ion 5.2 4.7 - 5.5 mg/dL  Parathyroid  hormone, intact (no Ca)   Collection Time: 09/30/24  8:00 AM  Result Value Ref Range   PTH 44 16 - 77 pg/mL  VITAMIN D  25 Hydroxy (Vit-D Deficiency, Fractures)   Collection Time: 09/30/24  8:00 AM  Result Value Ref Range   VITD 40.28 30.00 - 100.00 ng/mL  CBC with Differential/Platelet   Collection Time: 09/30/24  8:00 AM  Result Value Ref Range   WBC 4.5 4.0 - 10.5 K/uL   RBC 4.52 3.87 - 5.11 Mil/uL  Hemoglobin 13.6 12.0 - 15.0 g/dL   HCT 59.6 63.9 - 53.9 %   MCV 89.3 78.0 - 100.0 fl   MCHC 33.6 30.0 - 36.0 g/dL   RDW 87.0 88.4 - 84.4 %   Platelets 232.0 150.0 - 400.0 K/uL   Neutrophils Relative % 54.7 43.0 - 77.0 %   Lymphocytes Relative 33.2 12.0 - 46.0 %   Monocytes Relative 8.6 3.0 - 12.0 %   Eosinophils Relative 2.8 0.0 - 5.0 %   Basophils Relative 0.7 0.0 - 3.0 %   Neutro Abs 2.5 1.4 - 7.7 K/uL   Lymphs Abs 1.5 0.7 - 4.0 K/uL   Monocytes Absolute 0.4 0.1 - 1.0 K/uL   Eosinophils Absolute 0.1 0.0 - 0.7 K/uL   Basophils Absolute 0.0 0.0 - 0.1 K/uL  TSH   Collection Time: 09/30/24  8:00 AM  Result Value Ref Range   TSH 2.50 0.35 - 5.50 uIU/mL  Comprehensive metabolic panel with GFR   Collection Time: 09/30/24  8:00 AM  Result Value Ref Range   Sodium 142 135 - 145 mEq/L   Potassium 4.5 3.5 - 5.1 mEq/L   Chloride 103 96 - 112 mEq/L   CO2 31 19 - 32 mEq/L   Glucose, Bld 91 70 - 99 mg/dL   BUN 16 6 - 23 mg/dL   Creatinine, Ser 9.12 0.40 - 1.20 mg/dL   Total Bilirubin 0.7 0.2 - 1.2 mg/dL   Alkaline Phosphatase 52 39 - 117 U/L   AST 16 0 - 37 U/L   ALT 8 0 - 35 U/L   Total Protein 6.7 6.0 - 8.3 g/dL   Albumin 4.6 3.5 - 5.2 g/dL   GFR 26.74 >39.99 mL/min   Calcium 9.9 8.4 - 10.5 mg/dL  Lipid panel   Collection Time: 09/30/24  8:00 AM  Result Value Ref Range   Cholesterol 216 (H) 0 - 200 mg/dL   Triglycerides 840.9 (H) 0.0 - 149.0 mg/dL   HDL 35.79 >60.99 mg/dL   VLDL 68.1 0.0 - 59.9 mg/dL   LDL Cholesterol 879 (H)  0 - 99 mg/dL   Total CHOL/HDL Ratio 3    NonHDL 151.93     Assessment & Plan:   Problem List Items Addressed This Visit     Perineural cyst - Primary   Relevant Orders   MR THORACIC SPINE W WO CONTRAST     No orders of the defined types were placed in this encounter.   Orders Placed This Encounter  Procedures   MR THORACIC SPINE W WO CONTRAST    Standing Status:   Future    Expiration Date:   10/07/2025    GRA to provide read?:   Yes    If indicated for the ordered procedure, I authorize the administration of contrast media per Radiology protocol:   Yes    What is the patient's sedation requirement?:   No Sedation    Does the patient have a pacemaker or implanted devices?:   No    Preferred imaging location?:   [facility] (table limit-350lbs)    There are no Patient Instructions on file for this visit.  Follow up plan: No follow-ups on file.  Anton Blas, MD   "

## 2024-10-07 NOTE — Assessment & Plan Note (Signed)
 Reviewed DEXA scan results from 10/2023 showing osteoporosis at lumbar spine.  Encouraged good dietary calcium intake, regular weight bearing exercise, recommend vit D 1000 units daily.  Declines medication at this time - recommend repeat DEXA in 2 yr

## 2024-10-08 NOTE — Assessment & Plan Note (Signed)
 Incidentally noted this year.  Pt overall asxs.  Reviewed options - will repeat thoracic MRI this year per radiology recommendations.

## 2024-10-08 NOTE — Assessment & Plan Note (Signed)
 Levels have normalized without treatment

## 2024-10-19 ENCOUNTER — Ambulatory Visit: Admission: RE | Admit: 2024-10-19 | Discharge: 2024-10-19 | Attending: Family Medicine | Admitting: Family Medicine

## 2024-10-19 DIAGNOSIS — G96191 Perineural cyst: Secondary | ICD-10-CM | POA: Insufficient documentation

## 2024-10-19 MED ORDER — GADOBUTROL 1 MMOL/ML IV SOLN
6.0000 mL | Freq: Once | INTRAVENOUS | Status: AC | PRN
  Administered 2024-10-19: 6 mL via INTRAVENOUS

## 2024-11-07 ENCOUNTER — Ambulatory Visit: Payer: Self-pay | Admitting: Family Medicine

## 2024-11-07 DIAGNOSIS — M8000XS Age-related osteoporosis with current pathological fracture, unspecified site, sequela: Secondary | ICD-10-CM

## 2024-11-07 DIAGNOSIS — G96191 Perineural cyst: Secondary | ICD-10-CM

## 2024-11-07 DIAGNOSIS — S22080A Wedge compression fracture of T11-T12 vertebra, initial encounter for closed fracture: Secondary | ICD-10-CM | POA: Insufficient documentation

## 2025-01-30 ENCOUNTER — Other Ambulatory Visit

## 2025-10-02 ENCOUNTER — Other Ambulatory Visit

## 2025-10-09 ENCOUNTER — Encounter: Admitting: Family Medicine
